# Patient Record
Sex: Male | Born: 2000 | Race: White | Hispanic: No | Marital: Single | Smoking: Never smoker
Health system: Southern US, Community
[De-identification: ages and names within clinical notes are randomized; demographics above are authoritative.]

## PROBLEM LIST (undated history)

## (undated) DIAGNOSIS — F1910 Other psychoactive substance abuse, uncomplicated: Secondary | ICD-10-CM

## (undated) DIAGNOSIS — F319 Bipolar disorder, unspecified: Secondary | ICD-10-CM

---
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
## 2022-02-01 ENCOUNTER — Emergency Department (HOSPITAL_BASED_OUTPATIENT_CLINIC_OR_DEPARTMENT_OTHER)
Admission: EM | Admit: 2022-02-01 | Discharge: 2022-02-01 | Disposition: A | Discharge status: Home / Self Care | Payer: Self-pay | Attending: Emergency Medicine | Admitting: Emergency Medicine

## 2022-02-01 ENCOUNTER — Encounter (HOSPITAL_BASED_OUTPATIENT_CLINIC_OR_DEPARTMENT_OTHER): Payer: Self-pay | Admitting: Emergency Medicine

## 2022-02-01 ENCOUNTER — Emergency Department (HOSPITAL_BASED_OUTPATIENT_CLINIC_OR_DEPARTMENT_OTHER): Payer: Self-pay

## 2022-02-01 DIAGNOSIS — R519 Headache, unspecified: Secondary | ICD-10-CM | POA: Diagnosis not present

## 2022-02-01 DIAGNOSIS — S60222A Contusion of left hand, initial encounter: Secondary | ICD-10-CM | POA: Insufficient documentation

## 2022-02-01 DIAGNOSIS — Y92410 Unspecified street and highway as the place of occurrence of the external cause: Secondary | ICD-10-CM | POA: Insufficient documentation

## 2022-02-01 DIAGNOSIS — S0093XA Contusion of unspecified part of head, initial encounter: Secondary | ICD-10-CM | POA: Diagnosis not present

## 2022-02-01 DIAGNOSIS — S0990XA Unspecified injury of head, initial encounter: Secondary | ICD-10-CM

## 2022-02-01 DIAGNOSIS — S199XXA Unspecified injury of neck, initial encounter: Secondary | ICD-10-CM | POA: Diagnosis present

## 2022-02-01 DIAGNOSIS — S161XXA Strain of muscle, fascia and tendon at neck level, initial encounter: Secondary | ICD-10-CM | POA: Insufficient documentation

## 2022-02-01 MED ORDER — METHOCARBAMOL 500 MG PO TABS: 500.0000 mg | ORAL_TABLET | Freq: Three times a day (TID) | ORAL | 0 refills | Status: DC | PRN

## 2022-02-01 MED ORDER — DICLOFENAC SODIUM 1 % EX GEL: 2.0000 g | Freq: Four times a day (QID) | CUTANEOUS | 0 refills | Status: DC | PRN

## 2022-02-01 MED ORDER — IBUPROFEN 800 MG PO TABS: 800.0000 mg | ORAL_TABLET | Freq: Three times a day (TID) | ORAL | 0 refills | Status: DC | PRN
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
## 2022-03-23 ENCOUNTER — Emergency Department (HOSPITAL_COMMUNITY)
Admission: EM | Admit: 2022-03-23 | Discharge: 2022-03-23 | Disposition: A | Discharge status: Home / Self Care | Payer: Medicaid Other | Attending: Emergency Medicine | Admitting: Emergency Medicine

## 2022-03-23 ENCOUNTER — Encounter (HOSPITAL_COMMUNITY): Payer: Self-pay

## 2022-03-23 ENCOUNTER — Other Ambulatory Visit: Payer: Self-pay

## 2022-03-23 DIAGNOSIS — H5789 Other specified disorders of eye and adnexa: Secondary | ICD-10-CM | POA: Diagnosis present

## 2022-03-23 DIAGNOSIS — H1033 Unspecified acute conjunctivitis, bilateral: Secondary | ICD-10-CM | POA: Insufficient documentation

## 2022-03-23 MED ORDER — POLYMYXIN B-TRIMETHOPRIM 10000-0.1 UNIT/ML-% OP SOLN
1.0000 [drp] | OPHTHALMIC | Status: DC
  Administered 2022-03-23: 1 [drp] via OPHTHALMIC
  Filled 2022-03-23: qty 10

## 2022-03-23 NOTE — Discharge Instructions (Signed)
Discontinue use of your eye drops. ? ?Instill 1 drop of Polytrim drops (provided in ER) in each eye every 4 waking hours for the next 5 days.  Take Benadryl or Zyrtec daily.   ? ?Follow-up with the eye doctor if not improving in 2 days.   ? ?Return for worsening symptoms. ?

## 2022-03-23 NOTE — ED Triage Notes (Signed)
Pt has redness and drainage to both eyes x 3 days.  ?

## 2022-03-23 NOTE — ED Provider Notes (Signed)
?[facility]Y DEPT ?[facility] Emergency Department ?Provider Note ?MRN:  [id number]  ?Arrival date & time: 03/23/22    ? ?Chief Complaint   ?Conjunctivitis ?  ?History of Present Illness   ?Ronald Leach is a 21 y.o. year-old male presents to the ED with chief complaint of bilateral eye irritation x 3 days.  Denies FB.  Denies trauma.  States that they are itchy and irritated.  Has been using OTC visine without relief.  Reports mild sensitivity to bright light.  Denies loss of vision.  States that he has had some drainage. ? ?History provided by patient. ? ? ?Review of Systems  ?Pertinent review of systems noted in HPI.  ? ? ?Physical Exam  ? ?Vitals:  ? 03/23/22 0130  ?BP: 117/81  ?Pulse: 96  ?Resp: 16  ?Temp: 98.3 ?F (36.8 ?C)  ?SpO2: 95%  ?  ?CONSTITUTIONAL:  well-appearing, NAD ?NEURO:  Alert and oriented x 3, CN 3-12 grossly intact ?EYES:  eyes equal and reactive, bilateral conjunctival erythema, no purulence ?ENT/NECK:  Supple, no stridor  ?CARDIO:  appears well-perfused  ?PULM:  No respiratory distress,  ?GI/GU:  non-distended,  ?MSK/SPINE:  No gross deformities, no edema, moves all extremities  ?SKIN:  no rash, atraumatic ? ? ?*Additional and/or pertinent findings included in MDM below ? ?Diagnostic and Interventional Summary  ? ? EKG Interpretation ? ?Date/Time:    ?Ventricular Rate:    ?PR Interval:    ?QRS Duration:   ?QT Interval:    ?QTC Calculation:   ?R Axis:     ?Text Interpretation:   ?  ? ?  ? ?Labs Reviewed - No data to display  ?No orders to display  ?  ?Medications  ?trimethoprim-polymyxin b (POLYTRIM) ophthalmic solution 1 drop (has no administration in time range)  ?  ? ?Procedures  /  Critical Care ?Procedures ? ?ED Course and Medical Decision Making  ?I have reviewed the triage vital signs, the nursing notes, and pertinent available records from the EMR. ? ?Social Determinants Affecting Complexity of Care: ?Patient has decreased access to medical care. ? ? ?ED Course: ?   ?Patient here with conjunctival erythema.  Top differential diagnoses include conjunctivitis, iritis. ?Medical Decision Making ?Problems Addressed: ?Acute conjunctivitis of both eyes, unspecified acute conjunctivitis type: acute illness or injury ?   Details: DC OTC drops.  Trial polytrim, but thought more likely to be allergic.  Recommended OTC benadryl or Zyrtec.  Ophthalmology follow-up if not better in 2 days. ? ?Risk ?Prescription drug management. ? ?  ? ?Consultants: ?No consultations were needed in caring for this patient. ? ? ?Treatment and Plan: ? ? ?Emergency department workup does not suggest an emergent condition requiring admission or immediate intervention beyond  what has been performed at this time. The patient is safe for discharge and has  been instructed to return immediately for worsening symptoms, change in  symptoms or any other concerns ? ? ? ?Final Clinical Impressions(s) / ED Diagnoses  ? ?  ICD-10-CM   ?1. Acute conjunctivitis of both eyes, unspecified acute conjunctivitis type  H10.33   ?  ?  ?ED Discharge Orders   ? ? None  ? ?  ?  ? ? ?Discharge Instructions Discussed with and Provided to Patient:  ? ? ?Discharge Instructions   ? ?  ?Discontinue use of your eye drops. ? ?Instill 1 drop of Polytrim drops (provided in ER) in each eye every 4 waking hours for the next 5 days.  Take Benadryl or  Zyrtec daily.   ? ?Follow-up with the eye doctor if not improving in 2 days.   ? ?Return for worsening symptoms. ? ? ? ?  ?Roxy Horseman, PA-C ?03/23/22 0206 ? ?  ?Tilden Fossa, MD ?03/23/22 [phone] ? ?
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
## 2023-07-23 ENCOUNTER — Other Ambulatory Visit: Payer: Self-pay

## 2023-07-23 ENCOUNTER — Emergency Department (HOSPITAL_COMMUNITY)
Admission: EM | Admit: 2023-07-23 | Discharge: 2023-07-23 | Disposition: A | Discharge status: Home / Self Care | Payer: Medicaid Other | Attending: Emergency Medicine | Admitting: Emergency Medicine

## 2023-07-23 ENCOUNTER — Encounter (HOSPITAL_COMMUNITY): Payer: Self-pay | Admitting: Emergency Medicine

## 2023-07-23 DIAGNOSIS — Z202 Contact with and (suspected) exposure to infections with a predominantly sexual mode of transmission: Secondary | ICD-10-CM | POA: Insufficient documentation

## 2023-07-23 LAB — RPR: RPR Ser Ql: NONREACTIVE

## 2023-07-23 LAB — HEPATITIS PANEL, ACUTE
HCV Ab: NONREACTIVE
Hep A IgM: NONREACTIVE
Hep B C IgM: NONREACTIVE
Hepatitis B Surface Ag: NONREACTIVE

## 2023-07-23 LAB — HIV ANTIBODY (ROUTINE TESTING W REFLEX): HIV Screen 4th Generation wRfx: NONREACTIVE

## 2023-07-23 LAB — URINALYSIS, ROUTINE W REFLEX MICROSCOPIC
Bilirubin Urine: NEGATIVE
Glucose, UA: NEGATIVE mg/dL
Hgb urine dipstick: NEGATIVE
Ketones, ur: 5 mg/dL — AB
Leukocytes,Ua: NEGATIVE
Nitrite: NEGATIVE
Protein, ur: NEGATIVE mg/dL
Specific Gravity, Urine: 1.025 (ref 1.005–1.030)
pH: 6 (ref 5.0–8.0)
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
## 2023-07-23 NOTE — Discharge Instructions (Signed)
You have been evaluated for potential STD.  You will be notified of the result if you test positive for any specific infection and you will receive treatment as appropriate.  You may also follow-up on the result through MyChart, link below.  Return if you have any concern.
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
## 2023-07-24 LAB — GC/CHLAMYDIA PROBE AMP (~~LOC~~) NOT AT ARMC
Chlamydia: NEGATIVE
Comment: NEGATIVE
Comment: NORMAL
Neisseria Gonorrhea: NEGATIVE

## 2023-07-31 ENCOUNTER — Emergency Department
Admission: EM | Admit: 2023-07-31 | Discharge: 2023-08-01 | Disposition: A | Discharge status: Other Acute Inpatient Hospital | Payer: Medicaid Other | Attending: Emergency Medicine | Admitting: Emergency Medicine

## 2023-07-31 ENCOUNTER — Encounter: Payer: Self-pay | Admitting: Emergency Medicine

## 2023-07-31 ENCOUNTER — Other Ambulatory Visit: Payer: Self-pay

## 2023-07-31 DIAGNOSIS — F314 Bipolar disorder, current episode depressed, severe, without psychotic features: Secondary | ICD-10-CM | POA: Diagnosis present

## 2023-07-31 DIAGNOSIS — F32A Depression, unspecified: Secondary | ICD-10-CM | POA: Diagnosis not present

## 2023-07-31 DIAGNOSIS — A64 Unspecified sexually transmitted disease: Secondary | ICD-10-CM | POA: Insufficient documentation

## 2023-07-31 DIAGNOSIS — R45851 Suicidal ideations: Secondary | ICD-10-CM | POA: Insufficient documentation

## 2023-07-31 DIAGNOSIS — R45850 Homicidal ideations: Secondary | ICD-10-CM | POA: Insufficient documentation

## 2023-07-31 DIAGNOSIS — F1914 Other psychoactive substance abuse with psychoactive substance-induced mood disorder: Secondary | ICD-10-CM | POA: Diagnosis present

## 2023-07-31 DIAGNOSIS — F1520 Other stimulant dependence, uncomplicated: Secondary | ICD-10-CM | POA: Diagnosis present

## 2023-07-31 DIAGNOSIS — Z21 Asymptomatic human immunodeficiency virus [HIV] infection status: Secondary | ICD-10-CM | POA: Insufficient documentation

## 2023-07-31 DIAGNOSIS — R259 Unspecified abnormal involuntary movements: Secondary | ICD-10-CM | POA: Diagnosis not present

## 2023-07-31 DIAGNOSIS — Z79899 Other long term (current) drug therapy: Secondary | ICD-10-CM | POA: Diagnosis not present

## 2023-07-31 DIAGNOSIS — F419 Anxiety disorder, unspecified: Secondary | ICD-10-CM | POA: Insufficient documentation

## 2023-07-31 DIAGNOSIS — F319 Bipolar disorder, unspecified: Secondary | ICD-10-CM

## 2023-07-31 DIAGNOSIS — F1994 Other psychoactive substance use, unspecified with psychoactive substance-induced mood disorder: Secondary | ICD-10-CM

## 2023-07-31 LAB — COMPREHENSIVE METABOLIC PANEL
ALT: 14 U/L (ref 0–44)
AST: 19 U/L (ref 15–41)
Albumin: 4.7 g/dL (ref 3.5–5.0)
Alkaline Phosphatase: 65 U/L (ref 38–126)
Anion gap: 9 (ref 5–15)
BUN: 12 mg/dL (ref 6–20)
CO2: 27 mmol/L (ref 22–32)
Calcium: 9.2 mg/dL (ref 8.9–10.3)
Chloride: 102 mmol/L (ref 98–111)
Creatinine, Ser: 0.86 mg/dL (ref 0.61–1.24)
GFR, Estimated: >60 mL/min (ref >60)
Glucose, Bld: 110 mg/dL — ABNORMAL HIGH (ref 70–99)
Potassium: 3.7 mmol/L (ref 3.5–5.1)
Sodium: 138 mmol/L (ref 135–145)
Total Bilirubin: 0.9 mg/dL (ref 0.3–1.2)
Total Protein: 7.4 g/dL (ref 6.5–8.1)

## 2023-07-31 LAB — CBC
HCT: 44.4 % (ref 39.0–52.0)
Hemoglobin: 15.1 g/dL (ref 13.0–17.0)
MCH: 30.8 pg (ref 26.0–34.0)
MCHC: 34 g/dL (ref 30.0–36.0)
MCV: 90.6 fL (ref 80.0–100.0)
Platelets: 313 10*3/uL (ref 150–400)
RBC: 4.9 MIL/uL (ref 4.22–5.81)
RDW: 12.5 % (ref 11.5–15.5)
WBC: 10.7 10*3/uL — ABNORMAL HIGH (ref 4.0–10.5)
nRBC: 0 % (ref 0.0–0.2)

## 2023-07-31 LAB — SALICYLATE LEVEL: Salicylate Lvl: <7 mg/dL — ABNORMAL LOW (ref 7.0–30.0)

## 2023-07-31 LAB — ACETAMINOPHEN LEVEL: Acetaminophen (Tylenol), Serum: <10 ug/mL — ABNORMAL LOW (ref 10–30)

## 2023-07-31 LAB — ETHANOL: Alcohol, Ethyl (B): <10 mg/dL (ref <10)

## 2023-07-31 MED ORDER — HYDROXYZINE HCL 25 MG PO TABS: 25.0000 mg | ORAL_TABLET | Freq: Three times a day (TID) | ORAL | Status: DC | PRN

## 2023-07-31 MED ORDER — OLANZAPINE 5 MG PO TABS
2.5000 mg | ORAL_TABLET | Freq: Two times a day (BID) | ORAL | Status: DC
  Filled 2023-07-31 (×2): qty 1

## 2023-07-31 MED ORDER — TRAZODONE HCL 50 MG PO TABS: 50.0000 mg | ORAL_TABLET | Freq: Every day | ORAL | Status: DC

## 2023-07-31 NOTE — ED Notes (Signed)
Pt dressed out into the appropriate BH scrub attire by this RN and Doree Fudge, EDT, belongings include:  White shirt Multi-colored crocs Gray pants  Blacks socks Pink/blue underwear Rainbow colored watch Silver ring

## 2023-07-31 NOTE — ED Notes (Signed)
Pt request that chaplin come to see him. Chaplin paged and to bedside
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
## 2023-07-31 NOTE — Progress Notes (Signed)
   07/31/23 1900  Spiritual Encounters  Type of Visit Initial  Care provided to: Patient  Referral source Nurse (RN/NT/LPN)  Reason for visit Urgent spiritual support  OnCall Visit Yes  Spiritual Framework  Presenting Themes Meaning/purpose/sources of inspiration  Patient Stress Factors Major life changes;Lack of knowledge  Interventions  Spiritual Care Interventions Made Established relationship of care and support;Compassionate presence;Reflective listening;Normalization of emotions;Explored ethical dilemma  Intervention Outcomes  Outcomes Connection to spiritual care;Awareness around self/spiritual resourses;Awareness of support  Spiritual Care Plan  Spiritual Care Issues Still Outstanding No further spiritual care needs at this time (see row info)   Patient is dealing with major life changes and making some major life decisions. Patient is a drug addict that is dealing with some family issues and relationship issues as well. Patient is looking and hope for some life changing events in his life. Patient has faith in God but also understands that he have to change his ways for change to happen. PT said that he leaving any situation that going to effect his life in a negative way. PT seem please to have a chance to talk his plan out to someone.

## 2023-07-31 NOTE — Consult Note (Addendum)
[facility] Face-to-Face Psychiatry Consult   Reason for Consult:  SI Referring Physician:  Rochele Raring MD Patient Identification: Ronald Leach MRN:  [id number] Diagnosis:  Active Problems:   Substance induced mood disorder (HCC)   Bipolar disorder (HCC)   Total Time spent with patient: 30 minutes  "My aunt and dad are upset about me; I have dissociated from parts of my life that I don't want to deal with"   HPI:    Ronald Leach is a 22 y.o., male with past psychiatric history of bipolar disorder, methamphetamine use disorder, and suicidal ideation. He was recently admitted to a [facility] in July of this year. He presents to [facility] ED with law enforcement under IVC.  Per IVC paperwork taken out by patient's aunt "respondent has history of mental illness and isn't taking his medications as prescribed.  He is exhibiting rapid mood swings.  He is a substance abuser, namely meth. He threatened to kill his boyfriend and is threatening to kill himself.  He isn't eating and isn't sleeping.  He has a sexual transmitted disease and is refusing to get medical treatment and is willfully engaging in unprotected sex."   During evaluation Ronald Leach is seated in the interview room in no acute distress. He is alert, oriented x 4, cooperative and attentive. His mood is dysphoric and anxious with congruent affect. He has normal speech, and behavior. Objectively there is no evidence of psychosis or delusional thinking. The patient endorses elevated mood when he uses methamphetamine and reports last use was last night at 8PM. He reports that when he is coming off of the high he experiences lows and this is typically when he experiences passive suicidal ideation. He reports he typically uses methamphetamine twice daily. Patient is able to converse coherently, goal directed thoughts, no distractibility, or pre-occupation. He denies active suicidal/self-harm/homicidal ideation, psychosis, and paranoia. He  does endorse passive suicidal ideation without a plan. He is forthcoming about being depressed and does want to engage in care for this. Patient answered questions appropriately.    When asked about the statements made in the IVC paperwork Ronald Leach reports he and his boyfriend got in an argument on the phone and Ronald Leach was livid and screaming at the boyfriend. Unknown to Ronald Leach the conversation was recorded by the boyfriend and later shared with the patient's Aunt. Ronald Leach admits that he made statements to the boyfriend threatening to harm himself and threatening to harm the boyfriend. He reports he was livid with the boyfriend and unable to control his anger. He also feels his boyfriend was intentionally manipulating him into saying things knowing he was recording them to later be used against Ronald Leach. He states he needs to re-evaluate the relationship and mentions an imbalance in power dynamics, the boyfriend being manipulative, and there being a 10 year age difference that he feels contributes to the power dynamic. He also reports he and his boyfriend engage in methamphetamine use together.   Ronald Leach reports he lives with his father and is currently unemployed. He stopped working shortly after being discharged from [facility] citing the work environments being a stressor for him. He reports that he is currently on academic probation at [employer] and is working towards being able to re-enroll. He is not currently seeing a psychiatrist or therapist and reports non-compliance with Lithium because he did not like the way it made him feel. He reports feeling as though Lithium wiped all of his emotions  away.  Support encouragement and reassurance provided about ongoing stressors, patient provided with opportunity for questions.  Discussed recommendation for inpatient psychiatric admission for stabilization and treatment.  Discussed milieu and expectations. Patient is in agreement.  Past Psychiatric  History: Bipolar, Methamphetamine Use Disorder, Suicidal Ideation  Risk to Self:  Yes Risk to Others:  No Prior Inpatient Therapy:  Yes Prior Outpatient Therapy:  Denies  Past Medical History: History reviewed. No pertinent past medical history. History reviewed. No pertinent surgical history. Family History: History reviewed. No pertinent family history. Family Psychiatric  History: Denies Social History:  Social History   Substance and Sexual Activity  Alcohol Use None     Social History   Substance and Sexual Activity  Drug Use Never    Social History   Socioeconomic History   Marital status: Single    Spouse name: Not on file   Number of children: Not on file   Years of education: Not on file   Highest education level: Not on file  Occupational History   Not on file  Tobacco Use   Smoking status: Never   Smokeless tobacco: Never  Substance and Sexual Activity   Alcohol use: Not on file   Drug use: Never   Sexual activity: Not on file  Other Topics Concern   Not on file  Social History Narrative   Not on file   Social Determinants of Health   Financial Resource Strain: Not on file  Food Insecurity: Not on file  Transportation Needs: Not on file  Physical Activity: Not on file  Stress: Not on file  Social Connections: Not on file   Additional Social History:    Allergies:  No Known Allergies  Labs:  Results for orders placed or performed during the hospital encounter of 07/31/23 (from the past 48 hour(s))  Comprehensive metabolic panel     Status: Abnormal   Collection Time: 07/31/23  2:34 AM  Result Value Ref Range   Sodium 138 135 - 145 mmol/L   Potassium 3.7 3.5 - 5.1 mmol/L   Chloride 102 98 - 111 mmol/L   CO2 27 22 - 32 mmol/L   Glucose, Bld 110 (H) 70 - 99 mg/dL    Comment: Glucose reference range applies only to samples taken after fasting for at least 8 hours.   BUN 12 6 - 20 mg/dL   Creatinine, Ser 1.61 0.61 - 1.24 mg/dL   Calcium 9.2  8.9 - 09.6 mg/dL   Total Protein 7.4 6.5 - 8.1 g/dL   Albumin 4.7 3.5 - 5.0 g/dL   AST 19 15 - 41 U/L   ALT 14 0 - 44 U/L   Alkaline Phosphatase 65 38 - 126 U/L   Total Bilirubin 0.9 0.3 - 1.2 mg/dL   GFR, Estimated >04 >54 mL/min    Comment: (NOTE) Calculated using the CKD-EPI Creatinine Equation (2021)    Anion gap 9 5 - 15    Comment: Performed at [facility], [address] [place], [place] [address]  Ethanol     Status: None   Collection Time: 07/31/23  2:34 AM  Result Value Ref Range   Alcohol, Ethyl (B) <10 <10 mg/dL    Comment: (NOTE) Lowest detectable limit for serum alcohol is 10 mg/dL.  For medical purposes only. Performed at [facility], [address] [place], [place] [address]   Salicylate level     Status: Abnormal   Collection Time: 07/31/23  2:34 AM  Result Value  Ref Range   Salicylate Lvl <7.0 (L) 7.0 - 30.0 mg/dL    Comment: Performed at [facility], [address] [place], [place] [address]  Acetaminophen level     Status: Abnormal   Collection Time: 07/31/23  2:34 AM  Result Value Ref Range   Acetaminophen (Tylenol), Serum <10 (L) 10 - 30 ug/mL    Comment: (NOTE) Therapeutic concentrations vary significantly. A range of 10-30 ug/mL  may be an effective concentration for many patients. However, some  are best treated at concentrations outside of this range. Acetaminophen concentrations >150 ug/mL at 4 hours after ingestion  and >50 ug/mL at 12 hours after ingestion are often associated with  toxic reactions.  Performed at [facility], [address] [place], [place] [address]   cbc     Status: Abnormal   Collection Time: 07/31/23  2:34 AM  Result Value Ref Range   WBC 10.7 (H) 4.0 - 10.5 K/uL   RBC 4.90 4.22 - 5.81 MIL/uL   Hemoglobin 15.1 13.0 - 17.0 g/dL   HCT 82.9 56.2 - 13.0 %   MCV 90.6 80.0 - 100.0 fL   MCH 30.8 26.0 - 34.0 pg   MCHC 34.0 30.0 - 36.0 g/dL   RDW 86.5 78.4 - 69.6 %    Platelets 313 150 - 400 K/uL   nRBC 0.0 0.0 - 0.2 %    Comment: Performed at [facility], [address] [place], [place] [address]    Current Facility-Administered Medications  Medication Dose Route Frequency Provider Last Rate Last Admin   hydrOXYzine (ATARAX) tablet 25 mg  25 mg Oral TID PRN Furkan Keenum L, NP       OLANZapine (ZYPREXA) tablet 2.5 mg  2.5 mg Oral BID Kushi Kun L, NP       traZODone (DESYREL) tablet 50 mg  50 mg Oral QHS Syrus Nakama L, NP       Current Outpatient Medications  Medication Sig Dispense Refill   divalproex (DEPAKOTE ER) 500 MG 24 hr tablet Take 1,500 mg by mouth daily.     lithium carbonate (LITHOBID) 300 MG ER tablet Take 900 mg by mouth at bedtime.     sertraline (ZOLOFT) 50 MG tablet Take 50 mg by mouth daily.     DESCOVY 200-25 MG tablet Take 1 tablet by mouth daily.      Musculoskeletal: Strength & Muscle Tone: within normal limits Gait & Station: normal Patient leans: N/A  Psychiatric Specialty Exam:  Presentation  General Appearance:  Appropriate for Environment  Eye Contact: Fair  Speech: Normal Rate  Speech Volume: Decreased  Handedness: Right   Mood and Affect  Mood: Dysphoric; Anxious  Affect: Congruent   Thought Process  Thought Processes: Coherent  Descriptions of Associations:Intact  Orientation:Full (Time, Place and Person)  Thought Content:Logical  History of Schizophrenia/Schizoaffective disorder:No  Duration of Psychotic Symptoms:No data recorded Hallucinations:Hallucinations: None  Ideas of Reference:None  Suicidal Thoughts:Suicidal Thoughts: Yes, Passive SI Passive Intent and/or Plan: Without Intent; Without Plan  Homicidal Thoughts:Homicidal Thoughts: No   Sensorium  Memory:No data recorded Judgment: Poor  Insight: Poor   Executive Functions  Concentration: Good  Attention Span: Good  Recall: Good  Fund of Knowledge: Good  Language: Good   Psychomotor  Activity  Psychomotor Activity: Psychomotor Activity: Tremor   Assets  Assets: Desire for Improvement; Communication Skills   Sleep  Sleep: Sleep: Fair   Physical Exam HENT:     Head: Normocephalic.  Cardiovascular:  Rate and Rhythm: Normal rate.  Pulmonary:     Effort: Pulmonary effort is normal.  Musculoskeletal:        General: Normal range of motion.  Neurological:     General: No focal deficit present.     Mental Status: He is alert.  Psychiatric:        Mood and Affect: Mood is anxious and depressed.        Speech: Speech normal.        Behavior: Behavior is cooperative.        Thought Content: Thought content includes suicidal ideation.        Cognition and Memory: Cognition normal.    Review of Systems  Constitutional:  Negative for chills and fever.  Respiratory:  Negative for cough and shortness of breath.   Skin:  Negative for rash.  Neurological:  Negative for focal weakness.  Psychiatric/Behavioral:  Positive for depression, substance abuse and suicidal ideas. The patient is nervous/anxious.    Blood pressure 129/89, pulse 79, temperature 98.3 F (36.8 C), temperature source Oral, resp. rate 17, height 5\' 7"  (1.702 m), weight 55.3 kg, SpO2 100%. Body mass index is 19.11 kg/m.  Treatment Plan Summary: Daily contact with patient to assess and evaluate symptoms and progress in treatment  - Start Zyprexa 2.5 mg BID for mood stabilization. Patient reports he stopped taking Lithium prescribed at discharge from [facility] because he did not like how the medication made him feel.  - PRN Trazodone for sleep  - PRN Hydroxyzine for anxiety  Disposition: Recommend psychiatric Inpatient admission when medically cleared.  This document was prepared using Dragon voice recognition software and may include unintentional dictation errors.  Bishop Limbo DNP, MBA, PMHNP-BC, FNP-BC  Psychiatric Mental Health Nurse Practitioner [facility]

## 2023-07-31 NOTE — ED Notes (Signed)
IVC recommends inpatient

## 2023-07-31 NOTE — ED Notes (Signed)
IVC prior to arrival/ Consult ordered/Pending

## 2023-07-31 NOTE — ED Triage Notes (Signed)
Pt presents [place] triage via [place] [employer] under IVC. Per paperwork, the patient has had erratic mood swings and is non compliant with medications. Pt a has a known hx of STD and is isn't using safe sex practices. A&Ox4 at this time. Denies SI/HI nor hallucinations.

## 2023-07-31 NOTE — ED Provider Notes (Addendum)
[facility] Provider Note    Event Date/Time   First MD Initiated Contact with Patient 07/31/23 [phone]     (approximate)   History   Psychiatric Evaluation (/)   HPI  Ronald Leach is a 22 y.o. male with history of methamphetamine abuse who presents to the emergency department under involuntary commitment.  When patient is asked what brings him here to the emergency department he states "I just have a lot going on" he is not very forthcoming with information.  He denies having SI, HI or hallucinations.  Admits to methamphetamine use today.  Denies alcohol use.   Per IVC paperwork taken out by patient's aunt "respondent has history of mental illness that is not taking his medications as prescribed.  He is exhibiting rapid mood swings.  He is a substance abuser, namely meth.  He threatened to kill his boyfriend and is starting to kill himself.  He is not eating and is not sleeping.  He has a sexual transmitted disease and is refusing to get medical treatment and is willfully engaging in unprotected sex."  When patient is asked about his threats of harming himself and his boyfriend he states that his boyfriend also has mental illness and is a drug user and that he is a "catalyst" for him.  Patient states that he does not have any known sexually transmitted disease but officer at bedside states that aunt told him that patient has HIV.  Patient states he was just at the [facility] department for full STD screening.  He states he has had syphilis before but states this has been treated.  History provided by patient, IVC paperwork.    History reviewed. No pertinent past medical history.  History reviewed. No pertinent surgical history.  MEDICATIONS:  Prior to Admission medications   Medication Sig Start Date End Date Taking? Authorizing Provider  amoxicillin-clavulanate (AUGMENTIN) 875-125 MG tablet Take 1 tablet by mouth every 12 (twelve) hours.  04/21/21   Rancour, Jeannett Senior, MD  bacitracin ointment Apply 1 application topically 2 (two) times daily. 04/21/21   Rancour, Jeannett Senior, MD  diclofenac Sodium (VOLTAREN) 1 % GEL Apply 2 g topically 4 (four) times daily as needed. 02/01/22   Long, Arlyss Repress, MD  ibuprofen (ADVIL) 800 MG tablet Take 1 tablet (800 mg total) by mouth every 8 (eight) hours as needed for moderate pain. 02/01/22   Long, Arlyss Repress, MD  methocarbamol (ROBAXIN) 500 MG tablet Take 1 tablet (500 mg total) by mouth every 8 (eight) hours as needed for muscle spasms. 02/01/22   LongArlyss Repress, MD    Physical Exam   Triage Vital Signs: ED Triage Vitals  Encounter Vitals Group     BP 07/31/23 0224 (!) 134/92     Systolic BP Percentile --      Diastolic BP Percentile --      Pulse Rate 07/31/23 0224 85     Resp 07/31/23 0224 18     Temp 07/31/23 0224 98.7 F (37.1 C)     Temp Source 07/31/23 0224 Oral     SpO2 07/31/23 0224 100 %     Weight 07/31/23 0238 122 lb (55.3 kg)     Height 07/31/23 0238 5\' 7"  (1.702 m)     Head Circumference --      Peak Flow --      Pain Score 07/31/23 0240 6     Pain Loc --      Pain Education --  Exclude from Growth Chart --     Most recent vital signs: Vitals:   07/31/23 0224  BP: (!) 134/92  Pulse: 85  Resp: 18  Temp: 98.7 F (37.1 C)  SpO2: 100%    CONSTITUTIONAL: Alert, responds appropriately to questions. Well-appearing; well-nourished HEAD: Normocephalic, atraumatic EYES: Conjunctivae clear, pupils appear equal, sclera nonicteric ENT: normal nose; moist mucous membranes NECK: Supple, normal ROM CARD: RRR; S1 and S2 appreciated RESP: Normal chest excursion without splinting or tachypnea; breath sounds clear and equal bilaterally; no wheezes, no rhonchi, no rales, no hypoxia or respiratory distress, speaking full sentences ABD/GI: Non-distended; soft, non-tender, no rebound, no guarding, no peritoneal signs BACK: The back appears normal EXT: Normal ROM in all joints; no  deformity noted, no edema SKIN: Normal color for age and race; warm; no rash on exposed skin NEURO: Moves all extremities equally, normal speech PSYCH: Calm, cooperative, not responding to internal stimuli, no disorganized thought process, poor eye contact.  Denies SI, HI.   ED Results / Procedures / Treatments   LABS: (all labs ordered are listed, but only abnormal results are displayed) Labs Reviewed  COMPREHENSIVE METABOLIC PANEL - Abnormal; Notable for the following components:      Result Value   Glucose, Bld 110 (*)    All other components within normal limits  SALICYLATE LEVEL - Abnormal; Notable for the following components:   Salicylate Lvl <7.0 (*)    All other components within normal limits  ACETAMINOPHEN LEVEL - Abnormal; Notable for the following components:   Acetaminophen (Tylenol), Serum <10 (*)    All other components within normal limits  CBC - Abnormal; Notable for the following components:   WBC 10.7 (*)    All other components within normal limits  ETHANOL  URINE DRUG SCREEN, QUALITATIVE ([facility] ONLY)     EKG:  RADIOLOGY: My personal review and interpretation of imaging:    I have personally reviewed all radiology reports.   No results found.   PROCEDURES:  Critical Care performed: No   CRITICAL CARE Performed by: Rochele Raring   Total critical care time: 0 minutes  Critical care time was exclusive of separately billable procedures and treating other patients.  Critical care was necessary to treat or prevent imminent or life-threatening deterioration.  Critical care was time spent personally by me on the following activities: development of treatment plan with patient and/or surrogate as well as nursing, discussions with consultants, evaluation of patient's response to treatment, examination of patient, obtaining history from patient or surrogate, ordering and performing treatments and interventions, ordering and review of laboratory studies,  ordering and review of radiographic studies, pulse oximetry and re-evaluation of patient's condition.   Procedures    IMPRESSION / MDM / ASSESSMENT AND PLAN / ED COURSE  I reviewed the triage vital signs and the nursing notes.    Patient here for SI, HI today.  History of methamphetamine use.     DIFFERENTIAL DIAGNOSIS (includes but not limited to):   Mood disorder secondary to substance abuse, depression, anxiety   Patient's presentation is most consistent with acute presentation with potential threat to life or bodily function.   PLAN: Will obtain screening labs, urine.  Will consult TTS and psychiatry.  Patient under full IVC at this time.   MEDICATIONS GIVEN IN ED: Medications - No data to display   ED COURSE: Patient's labs are unremarkable other than a mild leukocytosis.  Negative Tylenol, salicylate and ethanol level.  Patient is medically cleared.  CONSULTS:  TTS and psychiatry consulted for further disposition.   5:47 AM  Spoke with Bishop Limbo, NP with psychiatry.  She recommends inpatient admission.  OUTSIDE RECORDS REVIEWED: Reviewed lab work on 07/23/2023.  HIV and syphilis nonreactive.  Gonorrhea and Chlamydia negative at that time.  Hepatitis panel also nonreactive.       FINAL CLINICAL IMPRESSION(S) / ED DIAGNOSES   Final diagnoses:  Suicidal ideation  Homicidal ideation     Rx / DC Orders   ED Discharge Orders     None        Note:  This document was prepared using Dragon voice recognition software and may include unintentional dictation errors.   Lashai Grosch, Layla Maw, DO 07/31/23 0430    Thomas Rhude, Layla Maw, DO 07/31/23 [phone]

## 2023-08-01 ENCOUNTER — Encounter (HOSPITAL_COMMUNITY): Payer: Self-pay | Admitting: Psychiatric/Mental Health

## 2023-08-01 ENCOUNTER — Inpatient Hospital Stay (HOSPITAL_COMMUNITY)
Admission: AD | Admit: 2023-08-01 | Discharge: 2023-08-06 | DRG: 885 | Disposition: A | Discharge status: Home / Self Care | Payer: Medicaid Other | Source: Intra-hospital | Attending: Pediatrics | Admitting: Pediatrics

## 2023-08-01 ENCOUNTER — Other Ambulatory Visit: Payer: Self-pay

## 2023-08-01 DIAGNOSIS — G4700 Insomnia, unspecified: Secondary | ICD-10-CM | POA: Diagnosis present

## 2023-08-01 DIAGNOSIS — F1520 Other stimulant dependence, uncomplicated: Secondary | ICD-10-CM | POA: Diagnosis present

## 2023-08-01 DIAGNOSIS — Z79899 Other long term (current) drug therapy: Secondary | ICD-10-CM | POA: Diagnosis not present

## 2023-08-01 DIAGNOSIS — F3110 Bipolar disorder, current episode manic without psychotic features, unspecified: Principal | ICD-10-CM

## 2023-08-01 DIAGNOSIS — Z818 Family history of other mental and behavioral disorders: Secondary | ICD-10-CM

## 2023-08-01 DIAGNOSIS — Z91148 Patient's other noncompliance with medication regimen for other reason: Secondary | ICD-10-CM

## 2023-08-01 DIAGNOSIS — F319 Bipolar disorder, unspecified: Secondary | ICD-10-CM | POA: Diagnosis present

## 2023-08-01 DIAGNOSIS — F333 Major depressive disorder, recurrent, severe with psychotic symptoms: Secondary | ICD-10-CM | POA: Diagnosis not present

## 2023-08-01 DIAGNOSIS — R41843 Psychomotor deficit: Secondary | ICD-10-CM | POA: Diagnosis present

## 2023-08-01 DIAGNOSIS — R45851 Suicidal ideations: Secondary | ICD-10-CM | POA: Diagnosis present

## 2023-08-01 DIAGNOSIS — F1914 Other psychoactive substance abuse with psychoactive substance-induced mood disorder: Secondary | ICD-10-CM | POA: Diagnosis not present

## 2023-08-01 DIAGNOSIS — F32A Depression, unspecified: Principal | ICD-10-CM | POA: Diagnosis present

## 2023-08-01 DIAGNOSIS — Z560 Unemployment, unspecified: Secondary | ICD-10-CM

## 2023-08-01 LAB — URINE DRUG SCREEN, QUALITATIVE (ARMC ONLY)
Amphetamines, Ur Screen: POSITIVE — AB
Barbiturates, Ur Screen: NOT DETECTED
Benzodiazepine, Ur Scrn: NOT DETECTED
Cannabinoid 50 Ng, Ur ~~LOC~~: POSITIVE — AB
Cocaine Metabolite,Ur ~~LOC~~: NOT DETECTED
MDMA (Ecstasy)Ur Screen: NOT DETECTED
Methadone Scn, Ur: NOT DETECTED
Opiate, Ur Screen: NOT DETECTED
Phencyclidine (PCP) Ur S: NOT DETECTED
Tricyclic, Ur Screen: NOT DETECTED

## 2023-08-01 LAB — BASIC METABOLIC PANEL
Anion gap: 9 (ref 5–15)
BUN: 13 mg/dL (ref 6–20)
CO2: 29 mmol/L (ref 22–32)
Calcium: 9.3 mg/dL (ref 8.9–10.3)
Chloride: 100 mmol/L (ref 98–111)
Creatinine, Ser: 0.85 mg/dL (ref 0.61–1.24)
GFR, Estimated: >60 mL/min (ref >60)
Glucose, Bld: 86 mg/dL (ref 70–99)
Potassium: 4.4 mmol/L (ref 3.5–5.1)
Sodium: 138 mmol/L (ref 135–145)

## 2023-08-01 MED ORDER — HYDROXYZINE HCL 25 MG PO TABS
25.0000 mg | ORAL_TABLET | Freq: Three times a day (TID) | ORAL | Status: DC | PRN
  Filled 2023-08-01: qty 1

## 2023-08-01 MED ORDER — NICOTINE POLACRILEX 2 MG MT GUM: 2.0000 mg | CHEWING_GUM | OROMUCOSAL | Status: DC | PRN

## 2023-08-01 MED ORDER — ZIPRASIDONE MESYLATE 20 MG IM SOLR: 20.0000 mg | INTRAMUSCULAR | Status: DC | PRN

## 2023-08-01 MED ORDER — TRAZODONE HCL 50 MG PO TABS
50.0000 mg | ORAL_TABLET | Freq: Every day | ORAL | Status: DC
  Administered 2023-08-05: 50 mg via ORAL
  Filled 2023-08-01 (×8): qty 1

## 2023-08-01 MED ORDER — HALOPERIDOL LACTATE 5 MG/ML IJ SOLN: 5.0000 mg | Freq: Three times a day (TID) | INTRAMUSCULAR | Status: DC | PRN

## 2023-08-01 MED ORDER — OLANZAPINE 2.5 MG PO TABS
2.5000 mg | ORAL_TABLET | Freq: Two times a day (BID) | ORAL | Status: DC
  Filled 2023-08-01 (×2): qty 1

## 2023-08-01 MED ORDER — LORAZEPAM 1 MG PO TABS: 1.0000 mg | ORAL_TABLET | ORAL | Status: DC | PRN

## 2023-08-01 MED ORDER — RISPERIDONE 0.5 MG PO TBDP: 2.0000 mg | ORAL_TABLET | Freq: Three times a day (TID) | ORAL | Status: DC | PRN

## 2023-08-01 MED ORDER — RISPERIDONE 2 MG PO TBDP: 2.0000 mg | ORAL_TABLET | Freq: Three times a day (TID) | ORAL | Status: DC | PRN

## 2023-08-01 MED ORDER — MAGNESIUM HYDROXIDE 400 MG/5ML PO SUSP
30.0000 mL | Freq: Every day | ORAL | Status: DC | PRN
  Administered 2023-08-03 – 2023-08-05 (×2): 30 mL via ORAL
  Filled 2023-08-01 (×2): qty 30

## 2023-08-01 MED ORDER — ARIPIPRAZOLE 5 MG PO TABS
5.0000 mg | ORAL_TABLET | Freq: Every day | ORAL | Status: DC
  Administered 2023-08-01 – 2023-08-02 (×2): 5 mg via ORAL
  Filled 2023-08-01 (×6): qty 1

## 2023-08-01 MED ORDER — DIPHENHYDRAMINE HCL 50 MG/ML IJ SOLN: 50.0000 mg | Freq: Three times a day (TID) | INTRAMUSCULAR | Status: DC | PRN

## 2023-08-01 MED ORDER — LORAZEPAM 1 MG PO TABS: 2.0000 mg | ORAL_TABLET | Freq: Three times a day (TID) | ORAL | Status: DC | PRN

## 2023-08-01 MED ORDER — TRAZODONE HCL 50 MG PO TABS: 50.0000 mg | ORAL_TABLET | Freq: Every evening | ORAL | Status: DC | PRN

## 2023-08-01 MED ORDER — HALOPERIDOL 5 MG PO TABS: 5.0000 mg | ORAL_TABLET | Freq: Three times a day (TID) | ORAL | Status: DC | PRN

## 2023-08-01 MED ORDER — HYDROXYZINE HCL 25 MG PO TABS
25.0000 mg | ORAL_TABLET | Freq: Three times a day (TID) | ORAL | Status: DC | PRN
  Administered 2023-08-01 – 2023-08-05 (×6): 25 mg via ORAL
  Filled 2023-08-01 (×5): qty 1

## 2023-08-01 MED ORDER — ACETAMINOPHEN 325 MG PO TABS: 650.0000 mg | ORAL_TABLET | Freq: Four times a day (QID) | ORAL | Status: DC | PRN

## 2023-08-01 MED ORDER — LORAZEPAM 2 MG/ML IJ SOLN: 2.0000 mg | Freq: Three times a day (TID) | INTRAMUSCULAR | Status: DC | PRN

## 2023-08-01 MED ORDER — ALUM & MAG HYDROXIDE-SIMETH 200-200-20 MG/5ML PO SUSP: 30.0000 mL | ORAL | Status: DC | PRN

## 2023-08-01 MED ORDER — DIPHENHYDRAMINE HCL 25 MG PO CAPS: 50.0000 mg | ORAL_CAPSULE | Freq: Three times a day (TID) | ORAL | Status: DC | PRN

## 2023-08-01 NOTE — H&P (Signed)
Psychiatric Admission Assessment Adult  Patient Identification: Ronald Leach MRN:  [id number] Date of Evaluation:  08/01/2023 Chief Complaint:  Depression [F32.A] Principal Diagnosis: Depression Diagnosis:  Principal Problem:   Depression Identifying information and reason for admission: The patient is a 22 year old Caucasian male with a long past history of bipolar disorder, amphetamine use disorder and suicidal ideations who was admitted on an IVC initiated by his aunt. History of Present Illness: The information was primarily obtained from patient interview as well as from the records.  Ronald Leach is a 22 year old male with a history of bipolar disorder who is on his third admission initiated by his aunt.  Apparently his admission was initiated after she heard a recording from Athony's boyfriend threatening to kill himself and the boyfriend.  Apparently has been abusing methamphetamine at least twice a day and has not been taking any medications.  He was sent to the [facility] ED with law enforcement on an IVC. As per the IVC papers the patient has been noncompliant with the medications.  He has been exhibiting rapid mood swings, threatening to kill his boyfriend and himself.  He has not been eating or sleeping.  Apparently also has a sexually-transmitted disease and has been refusing to get medical treatment and was willfully engaging in unprotected sex. Circumstances leading to the IVC include an unstable relationship with his current boyfriend.  Apparently there is a 20-year age difference and he feels that it contributes to a dynamic for struggle.  He admits that he and his boyfriend both engage in using amphetamine together.  He feels that there is some manipulation regarding his boyfriend taking out the IVC papers because he knew that he was recording him.  The patient lives with his father and is currently unemployed.  The patient's boyfriend also lives with his father and is unemployed.   They do not have a place of their own.  He is on academic probation from [facility].  He is not currently under the care of any psychiatrist or therapist and reports the prior exposure to lithium and Depakote both of which made him lose his emotions and he does not want to start them back.  On examination today: The patient was noted to be alert oriented and fairly cooperative but withdrawn and anxious.  He maintained fair eye contact.  His speech was of low volume with some latency and sometimes difficult to hear.  There is no obvious looseness of associations or flight of ideas but he continues to report feeling depressed and frustrated.  He is also angry that his aunt has IVCD him because of what his boyfriend had recorded after they had an argument.  He denies any active suicidal or homicidal ideations but does admit to mood swings and having feelings of depression to the point where he has passive suicidal ideations and does feel that life is not worth living.  He admits to abusing methamphetamine on a regular basis but feels that it makes him happy and cheerful and does not cause mood swings.  However he is family notices and endorses the mood swings.  Patient admits to history of bipolar disorder and has been tried on lithium and Depakote.  He is contracting for safety but does not want to try lithium or Depakote but is willing to consider a trial of Abilify and other medications as indicated.  Although tearful he is agreeing to contract for safety.  The plan is to initiate Abilify with the first dose now and continue with  the agitation protocol.  We will obtain additional collateral information as indicated. Associated Signs/Symptoms: Depression Symptoms:  depressed mood, anhedonia, insomnia, psychomotor agitation, psychomotor retardation, difficulty concentrating, suicidal thoughts without plan, weight loss, (Hypo) Manic Symptoms:  Distractibility, Elevated Mood, Flight of  Ideas, Impulsivity, Irritable Mood, Labiality of Mood, Sexually Inapproprite Behavior, Anxiety Symptoms:  Social Anxiety, Psychotic Symptoms:  Delusions, Ideas of Reference, PTSD Symptoms: Negative Total Time spent with patient: 30 minutes  Past Psychiatric History: Past psychiatric history is positive for history of bipolar disorder, methamphetamine use disorder and suicidal ideations.  He was hospitalized at least twice once to [facility].  The second admission was at [facility] in [place].  He was tried on Depakote and lithium.  He is currently on no medications.  Is the patient at risk to self? Yes.    Has the patient been a risk to self in the past 6 months? Yes.    Has the patient been a risk to self within the distant past? Yes.    Is the patient a risk to others? Yes.    Has the patient been a risk to others in the past 6 months? Yes.    Has the patient been a risk to others within the distant past? Yes.     [place] Scale:  Flowsheet Row [facility] ([facility]) [facility] from 07/31/2023 in [facility] Department at [facility] ED from 07/23/2023 in [facility] Department at [facility]  C-SSRS RISK CATEGORY No Risk No Risk No Risk        Prior Inpatient Therapy: Yes.   If yes, describe as noted above admitted to [facility], [facility] but none. Prior Outpatient Therapy: No. If yes, describe noncompliant.  Alcohol Screening: Patient refused Alcohol Screening Tool: Yes 1. How often do you have a drink containing alcohol?: Never 2. How many drinks containing alcohol do you have on a typical day when you are drinking?: 1 or 2 3. How often do you have six or more drinks on one occasion?: Never AUDIT-C Score: 0 4. How often during the last year have you found that you were not able to stop drinking once you had started?: Never 5. How often during the last year have  you failed to do what was normally expected from you because of drinking?: Never 6. How often during the last year have you needed a first drink in the morning to get yourself going after a heavy drinking session?: Never 7. How often during the last year have you had a feeling of guilt of remorse after drinking?: Never 8. How often during the last year have you been unable to remember what happened the night before because you had been drinking?: Never 9. Have you or someone else been injured as a result of your drinking?: No 10. Has a relative or friend or a doctor or another health worker been concerned about your drinking or suggested you cut down?: No Alcohol Use Disorder Identification Test Final Score (AUDIT): 0 Substance Abuse History in the last 12 months:  Yes.   Consequences of Substance Abuse: Acute paranoia, mood swings and suicidal or homicidal ideations. Previous Psychotropic Medications: Yes  Psychological Evaluations: Yes  Past Medical History: History reviewed. No pertinent past medical history. History reviewed. No pertinent surgical history. Family History: History reviewed. No pertinent family history. Family Psychiatric  History: Unknown at this time. Tobacco Screening:  Social History   Tobacco  Use  Smoking Status Never  Smokeless Tobacco Never    BH Tobacco Counseling     Are you interested in Tobacco Cessation Medications?  No value filed. Counseled patient on smoking cessation:  No value filed. Reason Tobacco Screening Not Completed: No value filed.       Social History:  Social History   Substance and Sexual Activity  Alcohol Use None     Social History   Substance and Sexual Activity  Drug Use Never    Additional Social History:                           Allergies:  No Known Allergies Lab Results:  Results for orders placed or performed during the hospital encounter of 07/31/23 (from the past 48 hour(s))  Comprehensive metabolic  panel     Status: Abnormal   Collection Time: 07/31/23  2:34 AM  Result Value Ref Range   Sodium 138 135 - 145 mmol/L   Potassium 3.7 3.5 - 5.1 mmol/L   Chloride 102 98 - 111 mmol/L   CO2 27 22 - 32 mmol/L   Glucose, Bld 110 (H) 70 - 99 mg/dL    Comment: Glucose reference range applies only to samples taken after fasting for at least 8 hours.   BUN 12 6 - 20 mg/dL   Creatinine, Ser 8.46 0.61 - 1.24 mg/dL   Calcium 9.2 8.9 - 96.2 mg/dL   Total Protein 7.4 6.5 - 8.1 g/dL   Albumin 4.7 3.5 - 5.0 g/dL   AST 19 15 - 41 U/L   ALT 14 0 - 44 U/L   Alkaline Phosphatase 65 38 - 126 U/L   Total Bilirubin 0.9 0.3 - 1.2 mg/dL   GFR, Estimated >95 >28 mL/min    Comment: (NOTE) Calculated using the CKD-EPI Creatinine Equation (2021)    Anion gap 9 5 - 15    Comment: Performed at [facility], [address] [place], [place] [address]  Ethanol     Status: None   Collection Time: 07/31/23  2:34 AM  Result Value Ref Range   Alcohol, Ethyl (B) <10 <10 mg/dL    Comment: (NOTE) Lowest detectable limit for serum alcohol is 10 mg/dL.  For medical purposes only. Performed at [facility], [address] [place], [place] [address]   Salicylate level     Status: Abnormal   Collection Time: 07/31/23  2:34 AM  Result Value Ref Range   Salicylate Lvl <7.0 (L) 7.0 - 30.0 mg/dL    Comment: Performed at [facility], [address] [place], [place] [address]  Acetaminophen level     Status: Abnormal   Collection Time: 07/31/23  2:34 AM  Result Value Ref Range   Acetaminophen (Tylenol), Serum <10 (L) 10 - 30 ug/mL    Comment: (NOTE) Therapeutic concentrations vary significantly. A range of 10-30 ug/mL  may be an effective concentration for many patients. However, some  are best treated at concentrations outside of this range. Acetaminophen concentrations >150 ug/mL at 4 hours after ingestion  and >50 ug/mL at 12 hours after ingestion are often associated with  toxic  reactions.  Performed at [facility], [address] [place], [place] [address]   cbc     Status: Abnormal   Collection Time: 07/31/23  2:34 AM  Result Value Ref Range   WBC 10.7 (H) 4.0 - 10.5 K/uL   RBC 4.90 4.22 - 5.81  MIL/uL   Hemoglobin 15.1 13.0 - 17.0 g/dL   HCT 84.6 96.2 - 95.2 %   MCV 90.6 80.0 - 100.0 fL   MCH 30.8 26.0 - 34.0 pg   MCHC 34.0 30.0 - 36.0 g/dL   RDW 84.1 32.4 - 40.1 %   Platelets 313 150 - 400 K/uL   nRBC 0.0 0.0 - 0.2 %    Comment: Performed at [facility], [address], [place], [place] [address]  Urine Drug Screen, Qualitative     Status: Abnormal   Collection Time: 07/31/23  8:08 AM  Result Value Ref Range   Tricyclic, Ur Screen NONE DETECTED NONE DETECTED   Amphetamines, Ur Screen POSITIVE (A) NONE DETECTED   MDMA (Ecstasy)Ur Screen NONE DETECTED NONE DETECTED   Cocaine Metabolite,Ur [place] NONE DETECTED NONE DETECTED   Opiate, Ur Screen NONE DETECTED NONE DETECTED   Phencyclidine (PCP) Ur S NONE DETECTED NONE DETECTED   Cannabinoid 50 Ng, Ur [place] POSITIVE (A) NONE DETECTED   Barbiturates, Ur Screen NONE DETECTED NONE DETECTED   Benzodiazepine, Ur Scrn NONE DETECTED NONE DETECTED   Methadone Scn, Ur NONE DETECTED NONE DETECTED    Comment: (NOTE) Tricyclics + metabolites, urine    Cutoff 1000 ng/mL Amphetamines + metabolites, urine  Cutoff 1000 ng/mL MDMA (Ecstasy), urine              Cutoff 500 ng/mL Cocaine Metabolite, urine          Cutoff 300 ng/mL Opiate + metabolites, urine        Cutoff 300 ng/mL Phencyclidine (PCP), urine         Cutoff 25 ng/mL Cannabinoid, urine                 Cutoff 50 ng/mL Barbiturates + metabolites, urine  Cutoff 200 ng/mL Benzodiazepine, urine              Cutoff 200 ng/mL Methadone, urine                   Cutoff 300 ng/mL  The urine drug screen provides only a preliminary, unconfirmed analytical test result and should not be used for non-medical purposes. Clinical consideration and  professional judgment should be applied to any positive drug screen result due to possible interfering substances. A more specific alternate chemical method must be used in order to obtain a confirmed analytical result. Gas chromatography / mass spectrometry (GC/MS) is the preferred confirm atory method. Performed at [facility], [address] [place], [place] [address]     Blood Alcohol level:  Lab Results  Component Value Date   [facility] <10 07/31/2023    Metabolic Disorder Labs:  No results found for: "HGBA1C", "MPG" No results found for: "PROLACTIN" No results found for: "CHOL", "TRIG", "HDL", "CHOLHDL", "VLDL", "LDLCALC"  Current Medications: Current Facility-Administered Medications  Medication Dose Route Frequency Provider Last Rate Last Admin   acetaminophen (TYLENOL) tablet 650 mg  650 mg Oral Q6H PRN Dixon, Elray Buba, NP       alum & mag hydroxide-simeth (MAALOX/MYLANTA) 200-200-20 MG/5ML suspension 30 mL  30 mL Oral Q4H PRN Dixon, Rashaun M, NP       diphenhydrAMINE (BENADRYL) capsule 50 mg  50 mg Oral TID PRN Jearld Lesch, NP       Or   diphenhydrAMINE (BENADRYL) injection 50 mg  50 mg Intramuscular TID PRN Jearld Lesch, NP       haloperidol (HALDOL) tablet 5 mg  5 mg  Oral TID PRN Jearld Lesch, NP       Or   haloperidol lactate (HALDOL) injection 5 mg  5 mg Intramuscular TID PRN Jearld Lesch, NP       hydrOXYzine (ATARAX) tablet 25 mg  25 mg Oral TID PRN Jearld Lesch, NP       hydrOXYzine (ATARAX) tablet 25 mg  25 mg Oral TID PRN Jearld Lesch, NP       LORazepam (ATIVAN) tablet 2 mg  2 mg Oral TID PRN Jearld Lesch, NP       Or   LORazepam (ATIVAN) injection 2 mg  2 mg Intramuscular TID PRN Jearld Lesch, NP       risperiDONE (RISPERDAL M-TABS) disintegrating tablet 2 mg  2 mg Oral Q8H PRN Charm Rings, NP       And   LORazepam (ATIVAN) tablet 1 mg  1 mg Oral PRN Charm Rings, NP       And   ziprasidone (GEODON)  injection 20 mg  20 mg Intramuscular PRN Charm Rings, NP       magnesium hydroxide (MILK OF MAGNESIA) suspension 30 mL  30 mL Oral Daily PRN Jearld Lesch, NP       nicotine polacrilex (NICORETTE) gum 2 mg  2 mg Oral PRN Sarita Bottom, MD       OLANZapine (ZYPREXA) tablet 2.5 mg  2.5 mg Oral BID Dixon, Rashaun M, NP       traZODone (DESYREL) tablet 50 mg  50 mg Oral QHS Dixon, Rashaun M, NP       traZODone (DESYREL) tablet 50 mg  50 mg Oral QHS PRN Jearld Lesch, NP       PTA Medications: Medications Prior to Admission  Medication Sig Dispense Refill Last Dose   DESCOVY 200-25 MG tablet Take 1 tablet by mouth daily. (Patient not taking: Reported on 07/31/2023)      divalproex (DEPAKOTE ER) 500 MG 24 hr tablet Take 1,500 mg by mouth daily.      lithium carbonate (LITHOBID) 300 MG ER tablet Take 900 mg by mouth at bedtime.      sertraline (ZOLOFT) 50 MG tablet Take 50 mg by mouth daily.       Musculoskeletal: Strength & Muscle Tone: within normal limits Gait & Station: normal Patient leans: N/A            Psychiatric Specialty Exam:  Presentation  General Appearance:  Appropriate for Environment; Casual; Disheveled  Eye Contact: Fair  Speech: Clear and Coherent; Slow  Speech Volume: Decreased  Handedness: Right   Mood and Affect  Mood: Anxious; Depressed  Affect: Tearful; Restricted   Thought Process  Thought Processes: Linear  Duration of Psychotic Symptoms:N/A Past Diagnosis of Schizophrenia or Psychoactive disorder: No  Descriptions of Associations:Intact  Orientation:Full (Time, Place and Person)  Thought Content:Rumination; Perseveration  Hallucinations:Hallucinations: None  Ideas of Reference:None  Suicidal Thoughts:Suicidal Thoughts: Yes, Passive SI Passive Intent and/or Plan: Without Intent; Without Plan  Homicidal Thoughts:Homicidal Thoughts: Yes, Passive   Sensorium  Memory: Immediate Fair; Recent Fair; Remote  Fair  Judgment: Poor  Insight: Poor   Executive Functions  Concentration: Poor  Attention Span: Fair  Recall: [employer] of Knowledge: Fair  Language: Fair   Psychomotor Activity  Psychomotor Activity: Psychomotor Activity: Normal   Assets  Assets: Manufacturing systems engineer; Desire for Improvement; Resilience   Sleep  Sleep: Sleep: Fair    Physical Exam: Physical Exam Constitutional:  Appearance: Normal appearance.  Neurological:     General: No focal deficit present.     Mental Status: He is alert and oriented to person, place, and time. Mental status is at baseline.    Review of Systems  Psychiatric/Behavioral:  Positive for depression, substance abuse and suicidal ideas. The patient is nervous/anxious.    Blood pressure 106/71, pulse 99, temperature 98.6 F (37 C), temperature source Oral, resp. rate 16, height 5\' 7"  (1.702 m), weight 55.3 kg, SpO2 100%. Body mass index is 19.11 kg/m.  Treatment Plan Summary: Daily contact with patient to assess and evaluate symptoms and progress in treatment, Medication management, and Plan the patient is going to be assessed on a daily basis for any SI/HI/AVH.  Medications will be initiated as appropriate.  Observation Level/Precautions:  15 minute checks  Laboratory:  CBC Chemistry Profile HbAIC HCG  Psychotherapy: Individual and group.  Medications: Patient has agreed to take Abilify.  Consultations: None  Discharge Concerns: Unstable environment  Estimated LOS: 4 to 5 days.  Other:     Physician Treatment Plan for Primary Diagnosis: Depression Long Term Goal(s): Improvement in symptoms so as ready for discharge  Short Term Goals: Ability to identify changes in lifestyle to reduce recurrence of condition will improve, Ability to verbalize feelings will improve, Ability to disclose and discuss suicidal ideas, Ability to demonstrate self-control will improve, Ability to identify and develop effective  coping behaviors will improve, and Ability to maintain clinical measurements within normal limits will improve  Physician Treatment Plan for Secondary Diagnosis: Principal Problem:   Depression  Long Term Goal(s): Improvement in symptoms so as ready for discharge  Short Term Goals: Ability to verbalize feelings will improve, Ability to identify and develop effective coping behaviors will improve, Ability to maintain clinical measurements within normal limits will improve, and Compliance with prescribed medications will improve  I certify that inpatient services furnished can reasonably be expected to improve the patient's condition.    Rex Kras, MD 9/21/202412:13 PM

## 2023-08-01 NOTE — Progress Notes (Signed)
   08/01/23 2330  Psych Admission Type (Psych Patients Only)  Admission Status Involuntary  Psychosocial Assessment  Patient Complaints Anxiety;Depression;Nervousness  Eye Contact Fair  Facial Expression Anxious  Affect Anxious  Speech Logical/coherent  Interaction Assertive  Motor Activity Fidgety;Restless  Appearance/Hygiene In scrubs  Behavior Characteristics Anxious  Mood Anxious;Irritable  Thought Process  Coherency WDL  Content Blaming others  Delusions None reported or observed  Perception WDL  Hallucination None reported or observed  Judgment Impaired  Confusion WDL  Danger to Self  Current suicidal ideation? Denies  Danger to Others  Danger to Others None reported or observed

## 2023-08-01 NOTE — [facility] Suicide Risk Assessment (Signed)
[facility] Admission Suicide Risk Assessment   Nursing information obtained from:    Demographic factors:  Male, Caucasian, Cardell Peach, lesbian, or bisexual orientation Current Mental Status:  Plan to harm others Loss Factors:  Financial problems / change in socioeconomic status Historical Factors:  Impulsivity Risk Reduction Factors:  Living with another person, especially a relative  Total Time spent with patient: 30 minutes Principal Problem: Depression Diagnosis:  Principal Problem:   Depression  Subjective Data: 22 year old Caucasian male with a history of bipolar depression and methamphetamine use disorder who was admitted on an IVC with suicidal ideations and homicidal ideations.  Continued Clinical Symptoms:  Alcohol Use Disorder Identification Test Final Score (AUDIT): 0 The "Alcohol Use Disorders Identification Test", Guidelines for Use in Primary Care, Second Edition.  World Science writer [facility]). Score between 0-7:  no or low risk or alcohol related problems. Score between 8-15:  moderate risk of alcohol related problems. Score between 16-19:  high risk of alcohol related problems. Score 20 or above:  warrants further diagnostic evaluation for alcohol dependence and treatment.   CLINICAL FACTORS:   Bipolar Disorder:   Bipolar II Mixed State Personality Disorders:   Cluster B Comorbid depression More than one psychiatric diagnosis   Musculoskeletal: Strength & Muscle Tone: within normal limits Gait & Station: normal Patient leans: N/A  Psychiatric Specialty Exam:  Presentation  General Appearance:  Appropriate for Environment  Eye Contact: Fair  Speech: Normal Rate  Speech Volume: Decreased  Handedness: Right   Mood and Affect  Mood: Dysphoric; Anxious  Affect: Congruent   Thought Process  Thought Processes: Coherent  Descriptions of Associations:Intact  Orientation:Full (Time, Place and Person)  Thought Content:Logical  History of  Schizophrenia/Schizoaffective disorder:No  Duration of Psychotic Symptoms:No data recorded Hallucinations:Hallucinations: None  Ideas of Reference:None  Suicidal Thoughts:Suicidal Thoughts: Yes, Passive SI Passive Intent and/or Plan: Without Intent; Without Plan  Homicidal Thoughts:Homicidal Thoughts: No   Sensorium  Memory:No data recorded Judgment: Poor  Insight: Poor   Executive Functions  Concentration: Good  Attention Span: Good  Recall: Good  Fund of Knowledge: Good  Language: Good   Psychomotor Activity  Psychomotor Activity: Psychomotor Activity: Tremor   Assets  Assets: Desire for Improvement; Communication Skills   Sleep  Sleep: Sleep: Fair    Physical Exam: Physical Exam HENT:     Head: Normocephalic.  Neurological:     General: No focal deficit present.     Mental Status: He is alert and oriented to person, place, and time. Mental status is at baseline.    Review of Systems  Psychiatric/Behavioral:  Positive for depression, substance abuse and suicidal ideas. The patient is nervous/anxious.    Blood pressure 106/71, pulse 99, temperature 98.6 F (37 C), temperature source Oral, resp. rate 16, height 5\' 7"  (1.702 m), weight 55.3 kg, SpO2 100%. Body mass index is 19.11 kg/m.   COGNITIVE FEATURES THAT CONTRIBUTE TO RISK:  Polarized thinking and Thought constriction (tunnel vision)    SUICIDE RISK:   Moderate:  Frequent suicidal ideation with limited intensity, and duration, some specificity in terms of plans, no associated intent, good self-control, limited dysphoria/symptomatology, some risk factors present, and identifiable protective factors, including available and accessible social support.  PLAN OF CARE: The patient is admitted to the safe and secure environment.  He has been placed on the 500 floor and his IVC has been upheld.  We will initiate medications as indicated.  I certify that inpatient services furnished can  reasonably be expected to improve the  patient's condition.   Rex Kras, MD 08/01/2023, 12:05 PM

## 2023-08-01 NOTE — Progress Notes (Signed)
Adult Psychoeducational Group Note  Date:  08/01/2023 Time:  8:30 PM  Group Topic/Focus:  Wrap-Up Group:   The focus of this group is to help patients review their daily goal of treatment and discuss progress on daily workbooks.  Participation Level:  Active  Participation Quality:  Appropriate  Affect:  Appropriate  Cognitive:  Appropriate  Insight: Appropriate  Engagement in Group:  Engaged  Modes of Intervention:  Discussion  Additional Comments:  Pt stated his day was great. Pt goal for this hospital stay is to get and feel better.  Wynema Birch D 08/01/2023, 8:30 PM

## 2023-08-01 NOTE — Tx Team (Signed)
Initial Treatment Plan 08/01/2023 12:09 PM Ronald Leach WGN:[id number]    PATIENT STRESSORS: Educational concerns   Financial difficulties   Marital or family conflict   Substance abuse     PATIENT STRENGTHS: Communication skills  Physical Health  Special hobby/interest  Supportive family/friends    PATIENT IDENTIFIED PROBLEMS: "At risk for suicide"   "Depression"   "Anxiety"   "Substance use"   "Relationship discord"   "Educational concerns"            DISCHARGE CRITERIA:  Ability to meet basic life and health needs Improved stabilization in mood, thinking, and/or behavior Verbal commitment to aftercare and medication compliance Withdrawal symptoms are absent or subacute and managed without 24-hour nursing intervention  PRELIMINARY DISCHARGE PLAN: Outpatient therapy Placement in alternative living arrangements Return to previous living arrangement Return to previous work or school arrangements  PATIENT/FAMILY INVOLVEMENT: This treatment plan has been presented to and reviewed with the patient, Ronald Leach, and/or family member.  The patient and family have been given the opportunity to ask questions and make suggestions.  Tyrone Apple, RN 08/01/2023, 12:09 PM

## 2023-08-01 NOTE — ED Provider Notes (Signed)
Emergency Medicine Observation Re-evaluation Note  Ronald Leach is a 22 y.o. male, seen on rounds today.  Pt initially presented to the ED for complaints of Psychiatric Evaluation (/) Currently, the patient is resting, voices no medical complaints.  Physical Exam  BP 118/74 (BP Location: Right Arm)   Pulse 71   Temp (!) 97.4 F (36.3 C) (Oral)   Resp 18   Ht 5\' 7"  (1.702 m)   Wt 55.3 kg   SpO2 100%   BMI 19.11 kg/m  Physical Exam General: Resting in no acute distress Cardiac: No cyanosis Lungs: Equal rise and fall Psych: Not agitated  ED Course / MDM  EKG:   I have reviewed the labs performed to date as well as medications administered while in observation.  Recent changes in the last 24 hours include no events overnight.  Plan  Current plan is for psychiatric disposition.    Irean Hong, MD 08/01/23 0530

## 2023-08-01 NOTE — ED Notes (Signed)
Patient has been accepted to [facility].  Patient assigned to room 300-02 Accepting physician is Dr. Abbott Pao.  Call report to [phone].  Representative was Antionette Cillo.   ER Staff is aware of it:  [facility] ER Secretary  Dr. Larinda Buttery, ER MD  Victorino Dike Patient's Nurse

## 2023-08-01 NOTE — Progress Notes (Signed)
Admit Note:  Ronald Leach. Ave (Prefers to be called Ronald Leach) is a 22 year old presented IVC to [facility] with PMH hx of bipolar affective disorder. 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 Pt admitted that his meth use is worsening and increasing in frequency. Pt states he has been using daily for 3 months, Pt was unable to specify amount used. Pt reported that he'd been arguing more frequent with his boyfriend, they have been together for 4 months. Pt admits that he made statements to the boyfriend threatening to harm himself and threatening to harm the boyfriend. Pt denies AVH. Pt reports he lives with his father and is currently unemployed. Pt reports that he is currently on academic probation at [employer] and is working towards being able to re-enroll. He is not currently seeing a psychiatrist or therapist and reports non-compliance with Lithium. UDS + amphetamines and THC. Pt oriented to unit rules and procedures. Skin was assessed and found to be WNL with the exception of bug bites on right foot and skin tear form tape on left forearm. Belongings in locker #6.  Pt signed consents. Snacks and fluids offered in which Pt accepted. Pt is able to verbal contract for safety. Pt refusing flu vaccine but is requesting nicotine gum. Pt remains safe.

## 2023-08-01 NOTE — Progress Notes (Signed)
Pt was accepted to [facility] TODAY9/21/2024; Bed Assignment 300-02 PENDING IVC uploaded to chart or faxed to [facility] [phone]  Dx is substance induced mood d/o. Please ensure that IVC is uploaded prior to transfer.   Pt meets inpatient criteria per Lajoyce Corners, NP    Attending Physician will be Dr. Sarita Bottom, MD   Report can be called to: -Adult unit: [phone]  Pt can arrive after: BED IS READY NOW  Care Team notified:Day [facility] Antoinette Alexandria, RN, Trooper Reiser,RN, Ronnie Helane Gunther, Counselor, [place], Registration Jocelyn Ova [place] Lynnae January.RN, Night [facility] Gretta Arab, RN, [employer], [place] [facility] [address]   [place], [place] 08/01/2023 @ 10:33 AM

## 2023-08-02 DIAGNOSIS — F333 Major depressive disorder, recurrent, severe with psychotic symptoms: Secondary | ICD-10-CM | POA: Diagnosis not present

## 2023-08-02 MED ORDER — WHITE PETROLATUM EX OINT
TOPICAL_OINTMENT | CUTANEOUS | Status: AC
  Administered 2023-08-02: 1
  Filled 2023-08-02: qty 5

## 2023-08-02 NOTE — Progress Notes (Signed)
Psychiatric Admission Assessment Adult  Patient Identification: Ronald Leach MRN:  [id number] Date of Evaluation:  08/02/2023 Chief Complaint:  Depression [F32.A] Principal Diagnosis: Depression Diagnosis:  Principal Problem:   Depression  Reason for admission   The patient is a 22 year old Caucasian male with a long past history of bipolar disorder, methamphetamine use disorder and suicidal ideations who was admitted on an IVC initiated by his aunt.  Chart review from last 24 hours   Staff reports that the patient has been compliant with scheduled medications and took Abilify 5 mg yesterday after admission.  He refused the trazodone at night.  He is withdrawn and frustrated but in general compliant.  He received as needed hydroxyzine once.  Yesterday, the psychiatry team made the following recommendations:  To begin Abilify 5 mg a day and increase as tolerated.  Consider a mood stabilizer.  Information obtained during interview   The patient was seen and evaluated today and the chart was reviewed.  He was lying in bed and was fairly alert oriented and cooperative but somewhat frustrated and withdrawn.  His speech was coherent with some latency but no obvious looseness of associations or flight of ideas.  Patient continues to endorse frustration at being here and he blames himself for being here.  He admits that he was on methamphetamine but he thinks and tries to rationalize that methamphetamine helps him with his mood and that meth works different in different people and for him it does not make him paranoid.  However he was noted to be clearly paranoid.  This is better today.  Patient admits to a past history of trials on Depakote and lithium and he felt that they have a not optimal and he does not want to restart that.  However his aunt and uncle both are diagnosed as having bipolar disorder.  He is willing to take Abilify and consider other medications as appropriate. He would like to  be discharged soon because he claims that he is only on academic probation, he also wants to get a place of his own. He denies any active SI/HI/AVH.  He denies mood swings.     Associated Signs/Symptoms: Depression Symptoms:  depressed mood, anhedonia, insomnia, psychomotor agitation, psychomotor retardation, difficulty concentrating, suicidal thoughts without plan, weight loss, (Hypo) Manic Symptoms:  Distractibility, Elevated Mood, Flight of Ideas, Impulsivity, Irritable Mood, Labiality of Mood, Sexually Inapproprite Behavior, Anxiety Symptoms:  Social Anxiety, Psychotic Symptoms:  Delusions, Ideas of Reference, PTSD Symptoms: Negative Total Time spent with patient: 30 minutes  Past Psychiatric History: Past psychiatric history is positive for history of bipolar disorder, methamphetamine use disorder and suicidal ideations.  He was hospitalized at least twice once to [facility].  The second admission was at [facility] in [place].  He was tried on Depakote and lithium.  He is currently on no medications.  Is the patient at risk to self? Yes.    Has the patient been a risk to self in the past 6 months? Yes.    Has the patient been a risk to self within the distant past? Yes.    Is the patient a risk to others? Yes.    Has the patient been a risk to others in the past 6 months? Yes.    Has the patient been a risk to others within the distant past? Yes.     [place] Scale:  Flowsheet Row [facility]) [facility] from [facility]2023 [facility] ED from 07/23/2023 in [facility] at [facility]  C-SSRS RISK CATEGORY No Risk No Risk No Risk        Prior Inpatient Therapy: Yes.   If yes, describe as noted above admitted to [facility] but none. Prior Outpatient Therapy: No. If yes, describe  noncompliant.  Alcohol Screening: Patient refused Alcohol Screening Tool: Yes 1. How often do you have a drink containing alcohol?: Never 2. How many drinks containing alcohol do you have on a typical day when you are drinking?: 1 or 2 3. How often do you have six or more drinks on one occasion?: Never AUDIT-C Score: 0 4. How often during the last year have you found that you were not able to stop drinking once you had started?: Never 5. How often during the last year have you failed to do what was normally expected from you because of drinking?: Never 6. How often during the last year have you needed a first drink in the morning to get yourself going after a heavy drinking session?: Never 7. How often during the last year have you had a feeling of guilt of remorse after drinking?: Never 8. How often during the last year have you been unable to remember what happened the night before because you had been drinking?: Never 9. Have you or someone else been injured as a result of your drinking?: No 10. Has a relative or friend or a doctor or another health worker been concerned about your drinking or suggested you cut down?: No Alcohol Use Disorder Identification Test Final Score (AUDIT): 0 Substance Abuse History in the last 12 months:  Yes.   Consequences of Substance Abuse: Acute paranoia, mood swings and suicidal or homicidal ideations. Previous Psychotropic Medications: Yes  Psychological Evaluations: Yes  Past Medical History: History reviewed. No pertinent past medical history. History reviewed. No pertinent surgical history. Family History: History reviewed. No pertinent family history. Family Psychiatric  History: Unknown at this time. Tobacco Screening:  Social History   Tobacco Use  Smoking Status Never  Smokeless Tobacco Never    BH Tobacco Counseling     Are you interested in Tobacco Cessation Medications?  No value filed. Counseled patient on smoking cessation:  No value  filed. Reason Tobacco Screening Not Completed: No value filed.       Social History:  Social History   Substance and Sexual Activity  Alcohol Use None     Social History   Substance and Sexual Activity  Drug Use Never    Additional Social History: Are you sexually active?: Yes What is your sexual orientation?: gay Does patient have children?: No                         Allergies:  No Known Allergies Lab Results:  Results for orders placed or performed during the hospital encounter of 08/01/23 (from the past 48 hour(s))  Basic metabolic panel     Status: None   Collection Time: 08/01/23  6:32 PM  Result Value Ref Range   Sodium 138 135 - 145 mmol/L   Potassium 4.4 3.5 - 5.1 mmol/L   Chloride 100 98 - 111 mmol/L   CO2 29 22 - 32 mmol/L   Glucose, Bld 86 70 - 99 mg/dL    Comment: Glucose reference range applies only to samples taken after fasting for at least 8 hours.   BUN 13  6 - 20 mg/dL   Creatinine, Ser 0.98 0.61 - 1.24 mg/dL   Calcium 9.3 8.9 - 11.9 mg/dL   GFR, Estimated >14 >78 mL/min    Comment: (NOTE) Calculated using the CKD-EPI Creatinine Equation (2021)    Anion gap 9 5 - 15    Comment: Performed at [facility], [address] [place], [place] [address]    Blood Alcohol level:  Lab Results  Component Value Date   ETH <10 07/31/2023    Metabolic Disorder Labs:  No results found for: "HGBA1C", "MPG" No results found for: "PROLACTIN" No results found for: "CHOL", "TRIG", "HDL", "CHOLHDL", "VLDL", "LDLCALC"  Current Medications: Current Facility-Administered Medications  Medication Dose Route Frequency Provider Last Rate Last Admin   acetaminophen (TYLENOL) tablet 650 mg  650 mg Oral Q6H PRN Dixon, Rashaun M, NP       alum & mag hydroxide-simeth (MAALOX/MYLANTA) 200-200-20 MG/5ML suspension 30 mL  30 mL Oral Q4H PRN Dixon, Rashaun M, NP       ARIPiprazole (ABILIFY) tablet 5 mg  5 mg Oral Daily Rex Kras, MD   5  mg at 08/02/23 0810   diphenhydrAMINE (BENADRYL) capsule 50 mg  50 mg Oral TID PRN Jearld Lesch, NP       Or   diphenhydrAMINE (BENADRYL) injection 50 mg  50 mg Intramuscular TID PRN Jearld Lesch, NP       haloperidol (HALDOL) tablet 5 mg  5 mg Oral TID PRN Jearld Lesch, NP       Or   haloperidol lactate (HALDOL) injection 5 mg  5 mg Intramuscular TID PRN Jearld Lesch, NP       hydrOXYzine (ATARAX) tablet 25 mg  25 mg Oral TID PRN Jearld Lesch, NP       hydrOXYzine (ATARAX) tablet 25 mg  25 mg Oral TID PRN Jearld Lesch, NP   25 mg at 08/01/23 1307   LORazepam (ATIVAN) tablet 2 mg  2 mg Oral TID PRN Jearld Lesch, NP       Or   LORazepam (ATIVAN) injection 2 mg  2 mg Intramuscular TID PRN Jearld Lesch, NP       risperiDONE (RISPERDAL M-TABS) disintegrating tablet 2 mg  2 mg Oral Q8H PRN Charm Rings, NP       And   LORazepam (ATIVAN) tablet 1 mg  1 mg Oral PRN Charm Rings, NP       And   ziprasidone (GEODON) injection 20 mg  20 mg Intramuscular PRN Charm Rings, NP       magnesium hydroxide (MILK OF MAGNESIA) suspension 30 mL  30 mL Oral Daily PRN Jearld Lesch, NP       nicotine polacrilex (NICORETTE) gum 2 mg  2 mg Oral PRN Sarita Bottom, MD       traZODone (DESYREL) tablet 50 mg  50 mg Oral QHS Dixon, Rashaun M, NP       traZODone (DESYREL) tablet 50 mg  50 mg Oral QHS PRN Jearld Lesch, NP       PTA Medications: Medications Prior to Admission  Medication Sig Dispense Refill Last Dose   DESCOVY 200-25 MG tablet Take 1 tablet by mouth daily. (Patient not taking: Reported on 07/31/2023)      divalproex (DEPAKOTE ER) 500 MG 24 hr tablet Take 1,500 mg by mouth daily.      lithium carbonate (LITHOBID) 300 MG ER tablet Take 900 mg  by mouth at bedtime.      sertraline (ZOLOFT) 50 MG tablet Take 50 mg by mouth daily.       Musculoskeletal: Strength & Muscle Tone: within normal limits Gait & Station: normal Patient leans:  N/A            Psychiatric Specialty Exam:  Presentation  General Appearance:  Casual; Disheveled  Eye Contact: Fair  Speech: Slow  Speech Volume: Decreased  Handedness: Right   Mood and Affect  Mood: Anxious; Depressed; Irritable  Affect: Blunt; Constricted   Thought Process  Thought Processes: Linear  Duration of Psychotic Symptoms:N/A Past Diagnosis of Schizophrenia or Psychoactive disorder: No  Descriptions of Associations:Intact  Orientation:Full (Time, Place and Person)  Thought Content:Perseveration; Rumination  Hallucinations:Hallucinations: None  Ideas of Reference:Paranoia  Suicidal Thoughts:Suicidal Thoughts: Yes, Passive  Homicidal Thoughts:Homicidal Thoughts: Yes, Passive   Sensorium  Memory: Immediate Fair; Remote Fair; Recent Fair  Judgment: Poor  Insight: Poor   Executive Functions  Concentration: Fair  Attention Span: Fair  Recall: [employer] of Knowledge: Fair  Language: Fair   Psychomotor Activity  Psychomotor Activity: Psychomotor Activity: Normal   Assets  Assets: Desire for Improvement; Communication Skills; Resilience   Sleep  Sleep: Sleep: Good Number of Hours of Sleep: 7.25    Physical Exam: Physical Exam Constitutional:      Appearance: Normal appearance.  Neurological:     General: No focal deficit present.     Mental Status: He is alert and oriented to person, place, and time. Mental status is at baseline.    Review of Systems  Psychiatric/Behavioral:  Positive for depression, substance abuse and suicidal ideas. The patient is nervous/anxious.    Blood pressure 112/82, pulse 97, temperature 97.9 F (36.6 C), temperature source Oral, resp. rate 16, height 5\' 7"  (1.702 m), weight 55.3 kg, SpO2 100%. Body mass index is 19.11 kg/m.  Treatment Plan Summary: Daily contact with patient to assess and evaluate symptoms and progress in treatment, Medication management, and Plan  the patient is going to be assessed on a daily basis for any SI/HI/AVH.  Medications will be initiated as appropriate.  Observation Level/Precautions:  15 minute checks  Laboratory:  CBC Chemistry Profile HbAIC HCG  Psychotherapy: Individual and group.  Medications: Continue Abilify 5 mg a day and increase as tolerated.  Consultations: None  Discharge Concerns: Unstable environment  Estimated LOS: 4 to 5 days.  Other: Collateral pending at this time.   Physician Treatment Plan for Primary Diagnosis: Depression Long Term Goal(s): Improvement in symptoms so as ready for discharge  Short Term Goals: Ability to identify changes in lifestyle to reduce recurrence of condition will improve, Ability to verbalize feelings will improve, Ability to disclose and discuss suicidal ideas, Ability to demonstrate self-control will improve, Ability to identify and develop effective coping behaviors will improve, and Ability to maintain clinical measurements within normal limits will improve  Physician Treatment Plan for Secondary Diagnosis: Principal Problem:   Depression  Long Term Goal(s): Improvement in symptoms so as ready for discharge  Short Term Goals: Ability to verbalize feelings will improve, Ability to identify and develop effective coping behaviors will improve, Ability to maintain clinical measurements within normal limits will improve, and Compliance with prescribed medications will improve  I certify that inpatient services furnished can reasonably be expected to improve the patient's condition.    Rex Kras, MD 9/22/20242:07 PM Patient ID: Ronald Leach, male   DOB: Mar 24, 2001, 22 y.o.   MRN: [id number]

## 2023-08-02 NOTE — Plan of Care (Signed)
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
## 2023-08-02 NOTE — [facility] Group Notes (Signed)
[facility] Group Notes:  (Nursing/MHT/Case Management/Adjunct)  Date:  08/02/2023  Time:  2:28 PM  Type of Therapy:  Psychoeducational Skills  Participation Level:  Active  Participation Quality:  disorganized  Affect:  labile  Cognitive:  disorganized  Insight:  improved  Engagement in Group:  engaged  Modes of Intervention:  Discussion, Education, and Exploration  Summary of Progress/Problems:  Mental health wellness podcast played from [place], regarding tips for promoting mental well being and healthy relationships.Pt attended late, appeared to sleep part of the time during group. thought content was tangential and somewhat disorganized.

## 2023-08-02 NOTE — Plan of Care (Signed)
Problem: Education: Goal: Knowledge of [facility] information/materials will improve Outcome: Progressing Goal: Emotional status will improve Outcome: Progressing Goal: Mental status will improve Outcome: Progressing Goal: Verbalization of understanding the information provided will improve Outcome: Progressing   Problem: Activity: Goal: Interest or engagement in activities will improve Outcome: Progressing Goal: Sleeping patterns will improve Outcome: Progressing

## 2023-08-02 NOTE — Plan of Care (Signed)
Problem: Activity: Goal: Interest or engagement in activities will improve Outcome: Progressing   Problem: Coping: Goal: Ability to demonstrate self-control will improve Outcome: Progressing

## 2023-08-02 NOTE — Progress Notes (Signed)
   08/02/23 2100  Psych Admission Type (Psych Patients Only)  Admission Status Involuntary  Psychosocial Assessment  Patient Complaints Depression  Eye Contact Fair  Facial Expression Anxious  Affect Anxious  Speech Logical/coherent  Interaction Assertive  Motor Activity Fidgety  Appearance/Hygiene Unremarkable  Behavior Characteristics Cooperative;Anxious  Mood Depressed;Anxious  Thought Process  Coherency WDL  Content Blaming others  Delusions None reported or observed  Perception WDL  Hallucination None reported or observed  Judgment Impaired  Confusion WDL  Danger to Self  Current suicidal ideation? Denies  Danger to Others  Danger to Others None reported or observed

## 2023-08-02 NOTE — [facility] Group Notes (Signed)
Adult Psychoeducational Group Note  Date:  08/02/2023 Time:  9:11 PM  Group Topic/Focus:  Wrap-Up Group:   The focus of this group is to help patients review their daily goal of treatment and discuss progress on daily workbooks.  Participation Level:  Active  Participation Quality:  Appropriate  Affect:  Appropriate  Cognitive:  Appropriate  Insight: Appropriate  Engagement in Group:  Engaged  Modes of Intervention:  Discussion  Additional Comments:   Pt states that he had a good day and was able to go outside with his peers. Pt states that he had a visit with his boyfriend which he enjoyed. Pt denied everything and states that he's been working on setting boundaries   Vevelyn Pat 08/02/2023, 9:11 PM

## 2023-08-03 ENCOUNTER — Encounter (HOSPITAL_COMMUNITY): Payer: Self-pay

## 2023-08-03 MED ORDER — DOCUSATE SODIUM 100 MG PO CAPS
100.0000 mg | ORAL_CAPSULE | Freq: Two times a day (BID) | ORAL | Status: DC
  Administered 2023-08-03 – 2023-08-06 (×7): 100 mg via ORAL
  Filled 2023-08-03 (×11): qty 1

## 2023-08-03 MED ORDER — ARIPIPRAZOLE 15 MG PO TABS
7.5000 mg | ORAL_TABLET | Freq: Every day | ORAL | Status: DC
  Administered 2023-08-03 – 2023-08-04 (×2): 7.5 mg via ORAL
  Filled 2023-08-03 (×3): qty 1

## 2023-08-03 NOTE — [facility] Group Notes (Signed)
Adult Psychoeducational Group Note  Date:  08/03/2023 Time:  10:35 AM  Group Topic/Focus:  Goals Group:   The focus of this group is to help patients establish daily goals to achieve during treatment and discuss how the patient can incorporate goal setting into their daily lives to aide in recovery.  Participation Level:  Did Not Attend  Participation Quality:    Affect:    Cognitive:    Insight:   Engagement in Group:    Modes of Intervention:    Additional Comments:    Sheran Lawless 08/03/2023, 10:35 AM

## 2023-08-03 NOTE — Group Note (Signed)
Recreation Therapy Group Note   Group Topic:Healthy Decision Making  Group Date: 08/03/2023 Start Time: 1010 End Time: 1045 Facilitators: Lulla Linville-McCall, LRT,CTRS Location: [address] Dayroom   Group Topic: Decision Making, Problem Solving, Communication  Goal Area(s) Addresses:  Patient will effectively work with peer towards shared goal.  Patient will identify factors that guided their decision making.  Patient will pro-socially communicate ideas during group session.   Intervention: Survival Scenario - pencil, paper  Group Description: Patients were given a scenario that they were going to be stranded on a deserted [place] for several months before being rescued. Writer tasked them with making a list of 15 things they would choose to bring with them for "survival". The list of items was prioritized most important to least. Each patient would come up with their own list, then work together to create a new list of 15 items while in a group of 3-5 peers. LRT discussed each person's list and how it differed from others. The debrief included discussion of priorities, good decisions versus bad decisions, and how it is important to think before acting so we can make the best decision possible. LRT tied the concept of effective communication among group members to patient's support systems outside of the hospital and its benefit post discharge.  Education: Pharmacist, community, Priorities, Support System, Discharge Planning   Education Outcome: Acknowledges education/In group clarification/Needs additional education   Affect/Mood: N/A   Participation Level: Did not attend    Clinical Observations/Individualized Feedback:     Plan: Continue to engage patient in RT group sessions 2-3x/week.   Cola Highfill-McCall, LRT,CTRS  08/03/2023 1:16 PM

## 2023-08-03 NOTE — Group Note (Signed)
LCSW Group Therapy Note   Group Date: 08/03/2023 Start Time: 1300 End Time: 1400   Type of Therapy and Topic:  Group Therapy: Challenging Core Beliefs  Participation Level:  Did Not Attend  Description of Group:  Patients were educated about core beliefs and asked to identify one harmful core belief that they have. Patients were asked to explore from where those beliefs originate. Patients were asked to discuss how those beliefs make them feel and the resulting behaviors of those beliefs. They were then be asked if those beliefs are true and, if so, what evidence they have to support them. Lastly, group members were challenged to replace those negative core beliefs with helpful beliefs.   Therapeutic Goals:   1. Patient will identify harmful core beliefs and explore the origins of such beliefs. 2. Patient will identify feelings and behaviors that result from those core beliefs. 3. Patient will discuss whether such beliefs are true. 4.  Patient will replace harmful core beliefs with helpful ones.  Summary of Patient Progress:  Did Not Attend   Therapeutic Modalities: Cognitive Behavioral Therapy; Solution-Focused Therapy   Beather Arbour 08/03/2023  3:42 PM

## 2023-08-03 NOTE — Progress Notes (Signed)
   08/03/23 0559  15 Minute Checks  Location Bedroom  Visual Appearance Calm  Behavior Sleeping  Sleep (Behavioral Health Patients Only)  Calculate sleep? (Click Yes once per 24 hr at 0600 safety check) Yes  Documented sleep last 24 hours 9.75

## 2023-08-03 NOTE — BH IP Treatment Plan (Signed)
Interdisciplinary Treatment and Diagnostic Plan Update  08/03/2023 Time of Session: 11:40am MEREL KARAMAN MRN: [id number]  Principal Diagnosis: Depression  Secondary Diagnoses: Principal Problem:   Depression   Current Medications:  Current Facility-Administered Medications  Medication Dose Route Frequency Provider Last Rate Last Admin   acetaminophen (TYLENOL) tablet 650 mg  650 mg Oral Q6H PRN Jearld Lesch, NP       alum & mag hydroxide-simeth (MAALOX/MYLANTA) 200-200-20 MG/5ML suspension 30 mL  30 mL Oral Q4H PRN Dixon, Rashaun M, NP       ARIPiprazole (ABILIFY) tablet 7.5 mg  7.5 mg Oral QHS Pashayan, Mardelle Matte, MD       diphenhydrAMINE (BENADRYL) capsule 50 mg  50 mg Oral TID PRN Jearld Lesch, NP       Or   diphenhydrAMINE (BENADRYL) injection 50 mg  50 mg Intramuscular TID PRN Durwin Nora, Rashaun M, NP       docusate sodium (COLACE) capsule 100 mg  100 mg Oral BID Lauro Franklin, MD   100 mg at 08/03/23 1150   haloperidol (HALDOL) tablet 5 mg  5 mg Oral TID PRN Jearld Lesch, NP       Or   haloperidol lactate (HALDOL) injection 5 mg  5 mg Intramuscular TID PRN Jearld Lesch, NP       hydrOXYzine (ATARAX) tablet 25 mg  25 mg Oral TID PRN Jearld Lesch, NP   25 mg at 08/02/23 2055   LORazepam (ATIVAN) tablet 2 mg  2 mg Oral TID PRN Jearld Lesch, NP       Or   LORazepam (ATIVAN) injection 2 mg  2 mg Intramuscular TID PRN Jearld Lesch, NP       magnesium hydroxide (MILK OF MAGNESIA) suspension 30 mL  30 mL Oral Daily PRN Jearld Lesch, NP   30 mL at 08/03/23 1151   nicotine polacrilex (NICORETTE) gum 2 mg  2 mg Oral PRN Sarita Bottom, MD       traZODone (DESYREL) tablet 50 mg  50 mg Oral QHS Dixon, Rashaun M, NP       traZODone (DESYREL) tablet 50 mg  50 mg Oral QHS PRN Jearld Lesch, NP       PTA Medications: Medications Prior to Admission  Medication Sig Dispense Refill Last Dose   DESCOVY 200-25 MG tablet Take 1 tablet by mouth daily.  (Patient not taking: Reported on 07/31/2023)      divalproex (DEPAKOTE ER) 500 MG 24 hr tablet Take 1,500 mg by mouth daily.      lithium carbonate (LITHOBID) 300 MG ER tablet Take 900 mg by mouth at bedtime.      sertraline (ZOLOFT) 50 MG tablet Take 50 mg by mouth daily.       Patient Stressors: Copy difficulties   Marital or family conflict   Substance abuse    Patient Strengths: Manufacturing systems engineer  Physical Health  Special hobby/interest  Supportive family/friends   Treatment Modalities: Medication Management, Group therapy, Case management,  1 to 1 session with clinician, Psychoeducation, Recreational therapy.   Physician Treatment Plan for Primary Diagnosis: Depression Long Term Goal(s): Improvement in symptoms so as ready for discharge   Short Term Goals: Ability to verbalize feelings will improve Ability to identify and develop effective coping behaviors will improve Ability to maintain clinical measurements within normal limits will improve Compliance with prescribed medications will improve Ability to identify changes in lifestyle to reduce recurrence  of condition will improve Ability to disclose and discuss suicidal ideas Ability to demonstrate self-control will improve  Medication Management: Evaluate patient's response, side effects, and tolerance of medication regimen.  Therapeutic Interventions: 1 to 1 sessions, Unit Group sessions and Medication administration.  Evaluation of Outcomes: Progressing  Physician Treatment Plan for Secondary Diagnosis: Principal Problem:   Depression  Long Term Goal(s): Improvement in symptoms so as ready for discharge   Short Term Goals: Ability to verbalize feelings will improve Ability to identify and develop effective coping behaviors will improve Ability to maintain clinical measurements within normal limits will improve Compliance with prescribed medications will improve Ability to identify  changes in lifestyle to reduce recurrence of condition will improve Ability to disclose and discuss suicidal ideas Ability to demonstrate self-control will improve     Medication Management: Evaluate patient's response, side effects, and tolerance of medication regimen.  Therapeutic Interventions: 1 to 1 sessions, Unit Group sessions and Medication administration.  Evaluation of Outcomes: Progressing   RN Treatment Plan for Primary Diagnosis: Depression Long Term Goal(s): Knowledge of disease and therapeutic regimen to maintain health will improve  Short Term Goals: Ability to remain free from injury will improve, Ability to verbalize frustration and anger appropriately will improve, Ability to participate in decision making will improve, Ability to verbalize feelings will improve, Ability to identify and develop effective coping behaviors will improve, and Compliance with prescribed medications will improve  Medication Management: RN will administer medications as ordered by provider, will assess and evaluate patient's response and provide education to patient for prescribed medication. RN will report any adverse and/or side effects to prescribing provider.  Therapeutic Interventions: 1 on 1 counseling sessions, Psychoeducation, Medication administration, Evaluate responses to treatment, Monitor vital signs and CBGs as ordered, Perform/monitor CIWA, COWS, AIMS and Fall Risk screenings as ordered, Perform wound care treatments as ordered.  Evaluation of Outcomes: Progressing   LCSW Treatment Plan for Primary Diagnosis: Depression Long Term Goal(s): Safe transition to appropriate next level of care at discharge, Engage patient in therapeutic group addressing interpersonal concerns.  Short Term Goals: Engage patient in aftercare planning with referrals and resources, Increase social support, Increase emotional regulation, Facilitate acceptance of mental health diagnosis and concerns, Identify  triggers associated with mental health/substance abuse issues, and Increase skills for wellness and recovery  Therapeutic Interventions: Assess for all discharge needs, 1 to 1 time with Social worker, Explore available resources and support systems, Assess for adequacy in community support network, Educate family and significant other(s) on suicide prevention, Complete Psychosocial Assessment, Interpersonal group therapy.  Evaluation of Outcomes: Progressing   Progress in Treatment: Attending groups: No. Participating in groups: No. Taking medication as prescribed: Yes. Toleration medication: Yes. Family/Significant other contact made: No, will contact:  Houston Croney, [phone] Patient understands diagnosis: No. Discussing patient identified problems/goals with staff: Yes. Medical problems stabilized or resolved: No. Denies suicidal/homicidal ideation: Yes. Issues/concerns per patient self-inventory: No.  New problem(s) identified: No, Describe:  none reported  New Short Term/Long Term Goal(s):medication stabilization, elimination of SI thoughts, development of comprehensive mental wellness plan.    Patient Goals:  "To have space, to have a clear mind, to gain back the weight I lost and to have a bowel movement."  Discharge Plan or Barriers: Patient recently admitted. CSW will continue to follow and assess for appropriate referrals and possible discharge planning.    Reason for Continuation of Hospitalization: Anxiety Depression Medical Issues Medication stabilization Suicidal ideation  Estimated Length of Stay:5-7 days  Last 3 [place] Suicide Severity Risk Score: Flowsheet Row [facility] (Current) [facility] from [facility]31/2023 in [facility] from 07/23/2023 in [facility] Emergency Department at [facility]  C-SSRS RISK CATEGORY No Risk No Risk No Risk        Last PHQ 2/9 Scores:     No data to display          Scribe for Treatment Team: Izell Isabel, LCSW 08/03/2023 1:10 PM
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
## 2023-08-03 NOTE — Progress Notes (Signed)
[facility] MD Progress Note  08/03/2023 12:38 PM ARPAN ARNWINE  MRN:  [id number] Subjective:   Ronald Leach is a 22 yr old male who presented on 9/20 to [facility] ED under IVC for rapid mood swings and SI.  PPHx is significant for Bipolar Disorder, Meth Abuse, and Medication Non-Compliance, and 2 Prior Psychiatric Hospitalizations (last- [facility]).   Case was discussed in the multidisciplinary team. MAR was reviewed and patient was compliant with some of his medications, he refused his Trazodone.  He received PRN Hydroxyzine last night.   Psychiatric Team made the following recommendations yesterday: -Continue Abilify 5 mg daily for mood stability     On interview today patient reports he slept fair last night (per report slept 9.5 hours).  He reports his appetite is doing good.  He reports no SI, HI, or AVH.  He reports no Paranoia or Ideas of Reference.  He reports some issues with his medications, specifically he reports that his Abilify is making him very tired.  Discussed with him that we could shift his Abilify to the evening as he has not received it this morning yet and he was agreeable to this.  He reports that he has not had a BM in about a week.  Discussed with him that we would start medication for this and he was agreeable.  He reports no other concerns at present.    Principal Problem: Depression Diagnosis: Principal Problem:   Depression  Total Time spent with patient:  I personally spent 35 minutes on the unit in direct patient care. The direct patient care time included face-to-face time with the patient, reviewing the patient's chart, communicating with other professionals, and coordinating care. Greater than 50% of this time was spent in counseling or coordinating care with the patient regarding goals of hospitalization, psycho-education, and discharge planning needs.   Past Psychiatric History: Bipolar Disorder, Meth Abuse, and Medication Non-Compliance, and 2 Prior  Psychiatric Hospitalizations (last- [facility]).  Past Medical History: History reviewed. No pertinent past medical history. History reviewed. No pertinent surgical history. Family History: History reviewed. No pertinent family history. Family Psychiatric  History:  Aunt and Uncle- Bipolar Disorder Social History:  Social History   Substance and Sexual Activity  Alcohol Use None     Social History   Substance and Sexual Activity  Drug Use Never    Social History   Socioeconomic History   Marital status: Single    Spouse name: Not on file   Number of children: Not on file   Years of education: Not on file   Highest education level: Not on file  Occupational History   Not on file  Tobacco Use   Smoking status: Never   Smokeless tobacco: Never  Substance and Sexual Activity   Alcohol use: Not on file   Drug use: Never   Sexual activity: Not on file  Other Topics Concern   Not on file  Social History Narrative   Not on file   Social Determinants of Health   Financial Resource Strain: Not on file  Food Insecurity: Patient Declined (08/01/2023)   Hunger Vital Sign    Worried About Running Out of Food in the Last Year: Patient declined    Ran Out of Food in the Last Year: Patient declined  Transportation Needs: Patient Declined (08/01/2023)   PRAPARE - Administrator, Civil Service (Medical): Patient declined    Lack of Transportation (Non-Medical): Patient declined  Physical Activity: Not on file  Stress: Not on file  Social Connections: Not on file   Additional Social History:                         Sleep: Good  Appetite:  Good  Current Medications: Current Facility-Administered Medications  Medication Dose Route Frequency Provider Last Rate Last Admin   acetaminophen (TYLENOL) tablet 650 mg  650 mg Oral Q6H PRN Dixon, Rashaun M, NP       alum & mag hydroxide-simeth (MAALOX/MYLANTA) 200-200-20 MG/5ML suspension 30 mL  30 mL Oral Q4H PRN  Dixon, Rashaun M, NP       ARIPiprazole (ABILIFY) tablet 7.5 mg  7.5 mg Oral QHS Shenica Holzheimer, Mardelle Matte, MD       diphenhydrAMINE (BENADRYL) capsule 50 mg  50 mg Oral TID PRN Jearld Lesch, NP       Or   diphenhydrAMINE (BENADRYL) injection 50 mg  50 mg Intramuscular TID PRN Durwin Nora, Rashaun M, NP       docusate sodium (COLACE) capsule 100 mg  100 mg Oral BID Lauro Franklin, MD   100 mg at 08/03/23 1150   haloperidol (HALDOL) tablet 5 mg  5 mg Oral TID PRN Jearld Lesch, NP       Or   haloperidol lactate (HALDOL) injection 5 mg  5 mg Intramuscular TID PRN Jearld Lesch, NP       hydrOXYzine (ATARAX) tablet 25 mg  25 mg Oral TID PRN Jearld Lesch, NP   25 mg at 08/02/23 2055   LORazepam (ATIVAN) tablet 2 mg  2 mg Oral TID PRN Jearld Lesch, NP       Or   LORazepam (ATIVAN) injection 2 mg  2 mg Intramuscular TID PRN Jearld Lesch, NP       magnesium hydroxide (MILK OF MAGNESIA) suspension 30 mL  30 mL Oral Daily PRN Jearld Lesch, NP   30 mL at 08/03/23 1151   nicotine polacrilex (NICORETTE) gum 2 mg  2 mg Oral PRN Sarita Bottom, MD       traZODone (DESYREL) tablet 50 mg  50 mg Oral QHS Dixon, Rashaun M, NP       traZODone (DESYREL) tablet 50 mg  50 mg Oral QHS PRN Jearld Lesch, NP        Lab Results:  Results for orders placed or performed during the hospital encounter of 08/01/23 (from the past 48 hour(s))  Basic metabolic panel     Status: None   Collection Time: 08/01/23  6:32 PM  Result Value Ref Range   Sodium 138 135 - 145 mmol/L   Potassium 4.4 3.5 - 5.1 mmol/L   Chloride 100 98 - 111 mmol/L   CO2 29 22 - 32 mmol/L   Glucose, Bld 86 70 - 99 mg/dL    Comment: Glucose reference range applies only to samples taken after fasting for at least 8 hours.   BUN 13 6 - 20 mg/dL   Creatinine, Ser 1.61 0.61 - 1.24 mg/dL   Calcium 9.3 8.9 - 09.6 mg/dL   GFR, Estimated >04 >54 mL/min    Comment: (NOTE) Calculated using the CKD-EPI Creatinine Equation (2021)     Anion gap 9 5 - 15    Comment: Performed at [facility], [address], [place], [place] [address]    Blood Alcohol level:  Lab Results  Component Value Date   [facility] <10 07/31/2023    Metabolic  Disorder Labs: No results found for: "HGBA1C", "MPG" No results found for: "PROLACTIN" No results found for: "CHOL", "TRIG", "HDL", "CHOLHDL", "VLDL", "LDLCALC"  Physical Findings: AIMS:  , ,  ,  ,    CIWA:    COWS:     Musculoskeletal: Strength & Muscle Tone: within normal limits Gait & Station:  laying in bed Patient leans: N/A  Psychiatric Specialty Exam:  Presentation  General Appearance:  Casual  Eye Contact: Poor  Speech: Clear and Coherent; Slow  Speech Volume: Decreased  Handedness: Right   Mood and Affect  Mood: Dysphoric; Irritable  Affect: Constricted; Depressed   Thought Process  Thought Processes: Linear  Descriptions of Associations:Intact  Orientation:Full (Time, Place and Person)  Thought Content:WDL  History of Schizophrenia/Schizoaffective disorder:No  Duration of Psychotic Symptoms:No data recorded Hallucinations:Hallucinations: None  Ideas of Reference:None  Suicidal Thoughts:Suicidal Thoughts: No  Homicidal Thoughts:Homicidal Thoughts: No   Sensorium  Memory: Immediate Fair; Recent Fair  Judgment: Poor  Insight: Poor   Executive Functions  Concentration: Fair  Attention Span: Fair  Recall: Fair  Fund of Knowledge: Fair  Language: Fair   Psychomotor Activity  Psychomotor Activity: Psychomotor Activity: Normal   Assets  Assets: Desire for Improvement; Communication Skills; Resilience; Physical Health   Sleep  Sleep: Sleep: Good Number of Hours of Sleep: 9.5    Physical Exam: Physical Exam Vitals and nursing note reviewed.  Constitutional:      General: He is not in acute distress.    Appearance: Normal appearance. He is normal weight. He is not ill-appearing or  toxic-appearing.  HENT:     Head: Normocephalic and atraumatic.  Pulmonary:     Effort: Pulmonary effort is normal.  Musculoskeletal:        General: Normal range of motion.  Neurological:     General: No focal deficit present.     Mental Status: He is alert.    Review of Systems  Respiratory:  Negative for cough and shortness of breath.   Cardiovascular:  Negative for chest pain.  Gastrointestinal:  Positive for constipation. Negative for abdominal pain, diarrhea, nausea and vomiting.  Neurological:  Negative for dizziness, weakness and headaches.  Psychiatric/Behavioral:  Negative for depression, hallucinations and suicidal ideas. The patient is not nervous/anxious.    Blood pressure 122/83, pulse (!) 107, temperature (!) 97.1 F (36.2 C), temperature source Oral, resp. rate 16, height 5\' 7"  (1.702 m), weight 55.3 kg, SpO2 99%. Body mass index is 19.11 kg/m.   Treatment Plan Summary: Daily contact with patient to assess and evaluate symptoms and progress in treatment and Medication management  Ronald Leach is a 22 yr old male who presented on 9/20 to [facility] ED under IVC for rapid mood swings and SI.  PPHx is significant for Bipolar Disorder, Meth Abuse, and Medication Non-Compliance, and 2 Prior Psychiatric Hospitalizations (last- [facility]).   Whit has been having sedation from the Abilify so we will change it to this evening.  We will also increase his Abilify for better mood stability.  Since he has not had a BM in about a week we will start Colace.  We will not make any other changes to his medications at this time.  We will continue to monitor.   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

## 2023-08-03 NOTE — Plan of Care (Signed)
Problem: Safety: Goal: Periods of time without injury will increase Outcome: Progressing   Pt A & O to self, place, situation, day and month. Observed to be irritable with blunted affect, depressed mood. Denies SI, HI, AVH and pain when assessed. Required multiple prompts to come out of his room for scheduled medications "It's so much going on. Coming to my room is one thing but I was not told about medications" despite MHT and writer going to his room on three separate occasions. Reports he slept well with good appetite. Isolative, guarded to room most of this shift. Out of bed for meals only. Received PRN MOM 30 ml for no BM X 3 days with result when reassessed at 1250. Safety checks maintained at Q 15 minutes intervals without incident.

## 2023-08-03 NOTE — Progress Notes (Signed)
   08/03/23 2345  Psych Admission Type (Psych Patients Only)  Admission Status Involuntary  Psychosocial Assessment  Patient Complaints Anxiety;Sadness  Eye Contact Fair  Facial Expression Anxious;Worried  Affect Appropriate to circumstance  Surveyor, minerals Activity Slow  Appearance/Hygiene Unremarkable  Behavior Characteristics Cooperative  Mood Anxious  Aggressive Behavior  Effect No apparent injury  Thought Process  Coherency Circumstantial  Content Blaming others  Delusions None reported or observed  Perception WDL  Hallucination None reported or observed  Judgment Poor  Confusion None  Danger to Self  Current suicidal ideation? Denies  Danger to Others  Danger to Others None reported or observed

## 2023-08-04 DIAGNOSIS — F3110 Bipolar disorder, current episode manic without psychotic features, unspecified: Principal | ICD-10-CM

## 2023-08-04 LAB — LIPID PANEL
Cholesterol: 199 mg/dL (ref 0–200)
HDL: 52 mg/dL (ref >40)
LDL Cholesterol: 128 mg/dL — ABNORMAL HIGH (ref 0–99)
Total CHOL/HDL Ratio: 3.8 RATIO
Triglycerides: 93 mg/dL (ref <150)
VLDL: 19 mg/dL (ref 0–40)

## 2023-08-04 NOTE — Plan of Care (Signed)

## 2023-08-04 NOTE — Plan of Care (Signed)
  Problem: Education: Goal: Knowledge of [facility] Education information/materials will improve Outcome: Progressing Goal: Emotional status will improve Outcome: Progressing Goal: Mental status will improve Outcome: Progressing   Problem: Activity: Goal: Sleeping patterns will improve Outcome: Progressing   Problem: Safety: Goal: Periods of time without injury will increase Outcome: Progressing

## 2023-08-04 NOTE — Group Note (Signed)
Date:  08/04/2023 Time:  8:59 PM  Group Topic/Focus:  Wrap-Up Group:   The focus of this group is to help patients review their daily goal of treatment and discuss progress on daily workbooks.    Participation Level:  Active  Participation Quality:  Appropriate and Sharing  Affect:  Appropriate  Cognitive:  Appropriate  Insight: Appropriate  Engagement in Group:  Engaged  Modes of Intervention:  Activity and Socialization  Additional Comments:  The patient stated that he had a good day today. The patient shared that he was able to catch up on some much needed sleep. The patient stated that he did not attend many groups today. The patient stated that his goal was to "gain insight/support from different sources". The patient rated his day a 10/10. The patient did share that he has been having some trouble sleeping throughout the night. The patient did participate in the ice breaker activity at the end of group.   Ronald Leach 08/04/2023, 8:59 PM

## 2023-08-04 NOTE — Group Note (Signed)
Recreation Therapy Group Note   Group Topic:Health and Wellness  Group Date: 08/04/2023 Start Time: 1040 End Time: 1110 Facilitators: Philana Younis-McCall, LRT,CTRS Location: [address] Dayroom   Group Topic: Wellness  Goal Area(s) Addresses:  Patient will define components of whole wellness. Patient will verbalize benefit of whole wellness.  Group Description: Exercise. LRT explained to patients the importance of being physically active. LRT stressed that being active doesn't mean do anything strenuous but simply getting your body moving. Patients took turns leading the group in the exercises of their choosing. Patients were to try and exercise for at least 30 minutes. Patients were instructed to take breaks and get water as needed.   Education: Wellness, Building control surveyor.   Education Outcome: Acknowledges education/In group clarification offered/Needs additional education.    Affect/Mood: N/A   Participation Level: Did not attend    Clinical Observations/Individualized Feedback:    Plan: Continue to engage patient in RT group sessions 2-3x/week.   Lisa Milian-McCall, LRT,CTRS  08/04/2023 12:17 PM

## 2023-08-04 NOTE — Group Note (Signed)
Recreation Therapy Group Note   Group Topic:Animal Assisted Therapy   Group Date: 08/04/2023 Start Time: 0945 End Time: 1030 Facilitators: Haydee Jabbour-McCall, LRT,CTRS Location: [address] Dayroom   Animal-Assisted Activity (AAA) Program Checklist/Progress Notes Patient Eligibility Criteria Checklist & Daily Group note for Rec Tx Intervention  AAA/T Program Assumption of Risk Form signed by Patient/ or Parent Legal Guardian Yes  Patient is free of allergies or severe asthma Yes  Patient reports no fear of animals Yes  Patient reports no history of cruelty to animals Yes  Patient understands his/her participation is voluntary Yes  Patient washes hands before animal contact Yes  Patient washes hands after animal contact Yes   Affect/Mood: Flat   Participation Level: Minimal   Participation Quality: Independent   Behavior: Attentive    Speech/Thought Process: Logical   Insight: Fair   Judgement: Fair    Modes of Intervention: Teaching laboratory technician   Patient Response to Interventions:  Attentive   Education Outcome:  In group clarification offered    Clinical Observations/Individualized Feedback: Pt was quiet but had some interaction with the therapy dog. Pt was also attentive to the interaction of others as well.    Plan: Continue to engage patient in RT group sessions 2-3x/week.   Season Astacio-McCall, LRT,CTRS 08/04/2023 12:47 PM

## 2023-08-04 NOTE — Progress Notes (Signed)
Recreation Therapy Notes  INPATIENT RECREATION THERAPY ASSESSMENT  Patient Details Name: Ronald Leach MRN: [id number] DOB: 2001-06-28 Today's Date: 08/04/2023       Information Obtained From: Patient  Able to Participate in Assessment/Interview: Yes  Patient Presentation: Alert  Reason for Admission (Per Patient): Suicidal Ideation, Substance Abuse  Patient Stressors: Family  Coping Skills:   Isolation, Journal, Exercise, Music, Deep Breathing, Meditate, Substance Abuse, Talk, Art, Prayer, Read, Hot Bath/Shower  Leisure Interests (2+):  Exercise - Walking, Social - Friends, Individual - Other (Comment), Music - Write music (Substance Use)  Frequency of Recreation/Participation: Other (Comment) (Daily)  Awareness of Community Resources:  Yes  Community Resources:  Library, [place], [place], Public affairs consultant  Current Use: Yes  If no, Barriers?:    Expressed Interest in [employer] Information: No  [place]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

## 2023-08-04 NOTE — Progress Notes (Signed)
   08/04/23 2215  Psych Admission Type (Psych Patients Only)  Admission Status Involuntary  Psychosocial Assessment  Patient Complaints Anxiety;Sadness  Eye Contact Fair  Facial Expression Anxious;Worried  Affect Appropriate to circumstance  Surveyor, minerals Activity Slow  Appearance/Hygiene Unremarkable  Behavior Characteristics Cooperative  Mood Depressed;Anxious  Aggressive Behavior  Effect No apparent injury  Thought Process  Coherency Circumstantial  Content Blaming others  Delusions None reported or observed  Perception WDL  Hallucination None reported or observed  Judgment Poor  Confusion None  Danger to Self  Current suicidal ideation? Denies  Danger to Others  Danger to Others None reported or observed

## 2023-08-04 NOTE — Progress Notes (Signed)
[facility] MD Progress Note  08/04/2023 11:25 AM INFINITE NAYLOR  MRN:  [id number] Subjective:   BAYDEN JEANNOT is a 22 yr old male who presented on 9/20 to [facility] ED under IVC for rapid mood swings and SI.  PPHx is significant for Bipolar Disorder, Meth Abuse, and Medication Non-Compliance, and 2 Prior Psychiatric Hospitalizations (last- [facility] 05/2023), and no history of Suicide Attempts or Self Injurious Behavior.   Case was discussed in the multidisciplinary team. MAR was reviewed and patient was compliant with some of his medications, he refused his Trazodone.  He received PRN Hydroxyzine last night.   Psychiatric Team made the following recommendations yesterday: -Change Abilify to 7.5 mg QHS for mood stability      On interview today patient reports he slept good last night.  He reports his appetite is doing good.  He reports no SI, HI, or AVH.  He reports no Paranoia or Ideas of Reference.  He reports no issues with his medications.  He reports that he is less tired today with having taken the Abilify at night yesterday.  He reports that he was able to have a BM last night.  Discussed with him that we would continue with the Colace for another 2 days and he was agreeable with this.  He reports no other concerns at present.     Principal Problem: Bipolar I disorder, most recent episode (or current) manic (HCC) Diagnosis: Principal Problem:   Bipolar I disorder, most recent episode (or current) manic (HCC) Active Problems:   Methamphetamine dependence (HCC)  Total Time spent with patient:  I personally spent 35 minutes on the unit in direct patient care. The direct patient care time included face-to-face time with the patient, reviewing the patient's chart, communicating with other professionals, and coordinating care. Greater than 50% of this time was spent in counseling or coordinating care with the patient regarding goals of hospitalization, psycho-education, and discharge planning  needs.   Past Psychiatric History: Bipolar Disorder, Meth Abuse, and Medication Non-Compliance, and 2 Prior Psychiatric Hospitalizations (last- [facility] 05/2023), and no history of Suicide Attempts or Self Injurious Behavior.  Past Medical History: History reviewed. No pertinent past medical history. History reviewed. No pertinent surgical history. Family History: History reviewed. No pertinent family history. Family Psychiatric  History:  Mother- EtOH, Pain Pill Abuse, 2 Suicide Attempts Father- EtOH Abuse Maternal Aunt- Bipolar Disorder, Polysubstance Abuse, 6 Suicide Attempts Maternal Uncle- Bipolar Disorder Paternal Aunt- Anxiety  Social History:  Social History   Substance and Sexual Activity  Alcohol Use None     Social History   Substance and Sexual Activity  Drug Use Never    Social History   Socioeconomic History   Marital status: Single    Spouse name: Not on file   Number of children: Not on file   Years of education: Not on file   Highest education level: Not on file  Occupational History   Not on file  Tobacco Use   Smoking status: Never   Smokeless tobacco: Never  Substance and Sexual Activity   Alcohol use: Not on file   Drug use: Never   Sexual activity: Not on file  Other Topics Concern   Not on file  Social History Narrative   Not on file   Social Determinants of Health   Financial Resource Strain: Not on file  Food Insecurity: Patient Declined (08/01/2023)   Hunger Vital Sign    Worried About Running Out of Food in the Last Year:  Patient declined    Barista in the Last Year: Patient declined  Transportation Needs: Patient Declined (08/01/2023)   PRAPARE - Administrator, Civil Service (Medical): Patient declined    Lack of Transportation (Non-Medical): Patient declined  Physical Activity: Not on file  Stress: Not on file  Social Connections: Not on file   Additional Social History:                          Sleep: Good  Appetite:  Good  Current Medications: Current Facility-Administered Medications  Medication Dose Route Frequency Provider Last Rate Last Admin   acetaminophen (TYLENOL) tablet 650 mg  650 mg Oral Q6H PRN Jearld Lesch, NP       alum & mag hydroxide-simeth (MAALOX/MYLANTA) 200-200-20 MG/5ML suspension 30 mL  30 mL Oral Q4H PRN Dixon, Rashaun M, NP       ARIPiprazole (ABILIFY) tablet 7.5 mg  7.5 mg Oral QHS Lauro Franklin, MD   7.5 mg at 08/03/23 2053   diphenhydrAMINE (BENADRYL) capsule 50 mg  50 mg Oral TID PRN Jearld Lesch, NP       Or   diphenhydrAMINE (BENADRYL) injection 50 mg  50 mg Intramuscular TID PRN Durwin Nora, Rashaun M, NP       docusate sodium (COLACE) capsule 100 mg  100 mg Oral BID Lauro Franklin, MD   100 mg at 08/04/23 0945   haloperidol (HALDOL) tablet 5 mg  5 mg Oral TID PRN Jearld Lesch, NP       Or   haloperidol lactate (HALDOL) injection 5 mg  5 mg Intramuscular TID PRN Jearld Lesch, NP       hydrOXYzine (ATARAX) tablet 25 mg  25 mg Oral TID PRN Jearld Lesch, NP   25 mg at 08/03/23 2054   LORazepam (ATIVAN) tablet 2 mg  2 mg Oral TID PRN Jearld Lesch, NP       Or   LORazepam (ATIVAN) injection 2 mg  2 mg Intramuscular TID PRN Jearld Lesch, NP       magnesium hydroxide (MILK OF MAGNESIA) suspension 30 mL  30 mL Oral Daily PRN Jearld Lesch, NP   30 mL at 08/03/23 1151   nicotine polacrilex (NICORETTE) gum 2 mg  2 mg Oral PRN Sarita Bottom, MD       traZODone (DESYREL) tablet 50 mg  50 mg Oral QHS Dixon, Rashaun M, NP       traZODone (DESYREL) tablet 50 mg  50 mg Oral QHS PRN Jearld Lesch, NP        Lab Results:  Results for orders placed or performed during the hospital encounter of 08/01/23 (from the past 48 hour(s))  Lipid panel     Status: Abnormal   Collection Time: 08/04/23  6:28 AM  Result Value Ref Range   Cholesterol 199 0 - 200 mg/dL   Triglycerides 93 <295 mg/dL   HDL 52 >28 mg/dL   Total  CHOL/HDL Ratio 3.8 RATIO   VLDL 19 0 - 40 mg/dL   LDL Cholesterol 413 (H) 0 - 99 mg/dL    Comment:        Total Cholesterol/HDL:CHD Risk Coronary Heart Disease Risk Table                     Men   Women  1/2 Average Risk   3.4   3.3  Average Risk       5.0   4.4  2 X Average Risk   9.6   7.1  3 X Average Risk  23.4   11.0        Use the calculated Patient Ratio above and the CHD Risk Table to determine the patient's CHD Risk.        ATP III CLASSIFICATION (LDL):  <100     mg/dL   Optimal  161-096  mg/dL   Near or Above                    Optimal  130-159  mg/dL   Borderline  045-409  mg/dL   High  >811     mg/dL   Very High Performed at [facility], [address] [place], [place] [address]      Blood Alcohol level:  Lab Results  Component Value Date   ETH <10 07/31/2023    Metabolic Disorder Labs: No results found for: "HGBA1C", "MPG" No results found for: "PROLACTIN" Lab Results  Component Value Date   CHOL 199 08/04/2023   TRIG 93 08/04/2023   HDL 52 08/04/2023   CHOLHDL 3.8 08/04/2023   VLDL 19 08/04/2023   LDLCALC 128 (H) 08/04/2023    Physical Findings: AIMS:  , ,  ,  ,    CIWA:    COWS:     Musculoskeletal: Strength & Muscle Tone: within normal limits Gait & Station:  laying in bed Patient leans: N/A  Psychiatric Specialty Exam:  Presentation  General Appearance:  Casual  Eye Contact: Fair  Speech: Clear and Coherent; Normal Rate  Speech Volume: Normal  Handedness: Right   Mood and Affect  Mood: Dysphoric  Affect: Congruent   Thought Process  Thought Processes: Linear  Descriptions of Associations:Intact  Orientation:Full (Time, Place and Person)  Thought Content:WDL; Logical  History of Schizophrenia/Schizoaffective disorder:No  Duration of Psychotic Symptoms:No data recorded Hallucinations:Hallucinations: None  Ideas of Reference:None  Suicidal Thoughts:Suicidal Thoughts: No  Homicidal  Thoughts:Homicidal Thoughts: No   Sensorium  Memory: Immediate Fair; Recent Fair  Judgment: Intact  Insight: Present   Executive Functions  Concentration: Fair  Attention Span: Fair  Recall: Fair  Fund of Knowledge: Fair  Language: Good   Psychomotor Activity  Psychomotor Activity: Psychomotor Activity: Normal   Assets  Assets: Communication Skills; Desire for Improvement; Resilience; Physical Health   Sleep  Sleep: Sleep: Good Number of Hours of Sleep: 7.5    Physical Exam: Physical Exam Vitals and nursing note reviewed.  Constitutional:      General: He is not in acute distress.    Appearance: Normal appearance. He is normal weight. He is not ill-appearing or toxic-appearing.  HENT:     Head: Normocephalic and atraumatic.  Pulmonary:     Effort: Pulmonary effort is normal.  Musculoskeletal:        General: Normal range of motion.  Neurological:     General: No focal deficit present.     Mental Status: He is alert.    Review of Systems  Respiratory:  Negative for cough and shortness of breath.   Cardiovascular:  Negative for chest pain.  Gastrointestinal:  Negative for abdominal pain, constipation, diarrhea, nausea and vomiting.  Neurological:  Negative for dizziness, weakness and headaches.  Psychiatric/Behavioral:  Negative for depression, hallucinations and suicidal ideas. The patient is not nervous/anxious.    Blood pressure (!) 109/93, pulse 96, temperature 97.9 F (36.6 C), temperature source Oral,  resp. rate 18, height 5\' 7"  (1.702 m), weight 55.3 kg, SpO2 99%. Body mass index is 19.11 kg/m.   Treatment Plan Summary: Daily contact with patient to assess and evaluate symptoms and progress in treatment and Medication management  LOVETT BENDA is a 22 yr old male who presented on 9/20 to [facility] ED under IVC for rapid mood swings and SI.  PPHx is significant for Bipolar Disorder, Meth Abuse, and Medication Non-Compliance, and 2  Prior Psychiatric Hospitalizations (last- [facility] 05/2023), and no history of Suicide Attempts or Self Injurious Behavior.   Hadriel has had improvement with the change of Abilify to nighttime dosing.  We will plan to further increase the Abilify tomorrow to achieve a mood stability dose.  We will not make any changes to his medication at this time.  We will continue to monitor.   Bipolar Disorder, Manic Vs Substance Induced Mood Disorder: -Continue Abilify 7.5 mg QHS for mood stability -Continue Agitation Protocol: Haldol/Ativan/Benadryl   Nicotine Dependence: -Continue Nicotine Gum 2 mg PRN   -Continue Trazodone 50 mg QHS for sleep -Continue PRN's: Tylenol, Maalox, Atarax, Milk of Magnesia, Trazodone   Lauro Franklin, MD 08/04/2023, 11:25 AM

## 2023-08-04 NOTE — Plan of Care (Signed)
  Problem: Education: Goal: Mental status will improve Outcome: Progressing Goal: Verbalization of understanding the information provided will improve Outcome: Progressing   Problem: Activity: Goal: Sleeping patterns will improve Outcome: Progressing
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
## 2023-08-04 NOTE — [facility] Group Notes (Signed)
Adult Psychoeducational Group Note  Date:  08/04/2023 Time:  4:30 PM  Group Topic/Focus:  Goals Group:   The focus of this group is to help patients establish daily goals to achieve during treatment and discuss how the patient can incorporate goal setting into their daily lives to aide in recovery. Orientation:   The focus of this group is to educate the patient on the purpose and policies of crisis stabilization and provide a format to answer questions about their admission.  The group details unit policies and expectations of patients while admitted.  Participation Level:  Did Not Attend  Participation Quality:    Affect:    Cognitive:    Insight:   Engagement in Group:    Modes of Intervention:    Additional Comments:    Sheran Lawless 08/04/2023, 4:30 PM

## 2023-08-05 MED ORDER — ARIPIPRAZOLE 10 MG PO TABS
10.0000 mg | ORAL_TABLET | Freq: Every day | ORAL | Status: DC
  Filled 2023-08-05: qty 1

## 2023-08-05 MED ORDER — ARIPIPRAZOLE 10 MG PO TABS
10.0000 mg | ORAL_TABLET | Freq: Every day | ORAL | Status: DC
  Administered 2023-08-05: 10 mg via ORAL
  Filled 2023-08-05 (×2): qty 1

## 2023-08-05 NOTE — Progress Notes (Signed)
Adult Psychoeducational Group Note  Date:  08/05/2023 Time:  9:46 PM  Group Topic/Focus:  Wrap-Up Group:   The focus of this group is to help patients review their daily goal of treatment and discuss progress on daily workbooks.  Participation Level:  Active  Participation Quality:  Appropriate  Affect:  Appropriate  Cognitive:  Appropriate  Insight: Appropriate  Engagement in Group:  Engaged  Modes of Intervention:  Activity  Additional Comments:  Pt attended and participated in NA group.  Ronald Leach [name] 08/05/2023, 9:46 PM

## 2023-08-05 NOTE — Progress Notes (Signed)
   08/05/23 1430  Spiritual Encounters  Type of Visit Initial  Care provided to: Patient  Referral source Nurse (RN/NT/LPN)  Reason for visit Routine spiritual support  OnCall Visit No  Spiritual Framework  Presenting Themes Values and beliefs;Meaning/purpose/sources of inspiration;Goals in life/care;Significant life change;Coping tools;Impactful experiences and emotions;Courage hope and growth;Community and relationships  Values/beliefs positivity and community are important to the patient; belief in a higher power  Community/Connection Faith community;Other (comment) (NA community)  Strengths patien has an understanding of his disorder, states he is medication compliant, patient has hope for the future  Patient Stress Factors Family relationships  Family Stress Factors Not reviewed  Interventions  Spiritual Care Interventions Made Established relationship of care and support;Compassionate presence;Reflective listening;Normalization of emotions;Narrative/life review;Explored values/beliefs/practices/strengths;Encouragement  Intervention Outcomes  Outcomes Connection to spiritual care;Awareness of support;Awareness around self/spiritual resourses;Connection to values and goals of care;Connected to spiritual community;Awareness of health;Reduced anxiety  Spiritual Care Plan  Spiritual Care Issues Still Outstanding No further spiritual care needs at this time (see row info)   Patient has a positive outlook. He has found connection with others here and finds hope in observing how others have faced their own challenges. Chaplain helped patient to explore how he can find hope within himself. Patient and chaplain discussed his practices of self care and connection with a higher power. Patient looks forward to continuing to have a sense of community with his NA group as well as beginning to attend church in the episcopal tradition. Patient shared with chaplain about his struggles with body image, his  sexuality and drugs. Patient appears to be owning these issues and attempting to deal with them. He finds help and hope in the feeling of community with others who have experienced similar struggles.   Arlyce Dice, Chaplain Resident

## 2023-08-05 NOTE — [facility] Group Notes (Signed)
Spirituality group facilitated by Kathleen Argue, B[facility].  Group Description: Group focused on topic of hope. Patients participated in facilitated discussion around topic, connecting with one another around experiences and definitions for hope. Group members engaged with visual explorer photos, reflecting on what hope looks like for them today. Group engaged in discussion around how their definitions of hope are present today in hospital.  Modalities: Psycho-social ed, Adlerian, Narrative, MI  Patient Progress: Ronald Leach attended group and actively engaged and participated in group conversation and activities.  His comments demonstrated good insight and contributed positively to the group conversaiton.

## 2023-08-05 NOTE — Progress Notes (Signed)
[facility]/BM[facility] LCSW Progress Note   08/05/2023    2:16 PM  Clement Husbands      Type of Note: Follow up / DC Collateral    CSW spoke with patient today to get verbal consent to call his dad for safety planning since that is were he will return back to. CSW did ask patient how he was feeling and patient said that he felt "great", his medication is working and the different groups he has attend, have been "awesome". Patient stated, " it feels good, expressing how I feel and listening to others and their own dialogues". CSW will continue to assist.     Signed:   Jacob Moores, MSW, [facility] 08/05/2023 2:16 PM

## 2023-08-05 NOTE — Group Note (Signed)
Recreation Therapy Group Note   Group Topic:Self-Esteem  Group Date: 08/05/2023 Start Time: 1014 End Time: 1055 Facilitators: Jonah Nestle-McCall, LRT,CTRS Location: [address] Dayroom   Group Topic: Self Esteem    Goal Area(s) Addresses:  Patient will appropriately identify what self esteem is.  Patient will create a shield of armor describing themselves.  Patient will successfully identify positive attributes about themselves.  Patient will acknowledge benefit of improved self-esteem.  Patient will follow instructions on 1st prompt.    Intervention / Activity: Self-Esteem Shield. Patient attended a recreation therapy group session focused on self esteem. Patient identified what self esteem is, and why it is important to have high self esteem during group discussion. LRT gave patiens a sheet with a breakdown of what each quadrant was to represent. Patient was asked to create their own shield to show off their unique attributes, four quadrants reflected the following:  The Upper Left quadrant- reasons they are unique/special. The Upper Right quadrant- things that they love to do. The Lower Left quadrant- goals for their future. The Lower Right quadrant- character words that describe them.    Patients were provided sheets with the shield printed on them and colored pencils, markers and crayons to complete the activity.  Patients and writer had group related discussions while individually working on their activity.  Patients were debriefed on the importance of healthy self esteem and offered a handout for ways to increase self esteem.    Education: Self esteem, Communication, Positive self-talk, Discharge Planning   Education Outcome: Acknowledges education/Verbalizes understanding of Education/In group clarification offered/Needs further education   Affect/Mood: N/A   Participation Level: Did not attend    Clinical Observations/Individualized Feedback:     Plan: Continue  to engage patient in RT group sessions 2-3x/week.   Amiee Wiley-McCall, LRT,CTRS  08/05/2023 11:58 AM

## 2023-08-05 NOTE — Progress Notes (Signed)
[facility] MD Progress Note  08/05/2023 10:38 AM Ronald Leach  MRN:  [id number] Subjective:   Ronald Leach is a 22 yr old male who presented on 9/20 to [facility] ED under IVC for rapid mood swings and SI.  PPHx is significant for Bipolar Disorder, Meth Abuse, and Medication Non-Compliance, and 2 Prior Psychiatric Hospitalizations (last- [facility] 05/2023), and no history of Suicide Attempts or Self Injurious Behavior.   Case was discussed in the multidisciplinary team. MAR was reviewed and patient was compliant with some of his medications, he refused his Trazodone.  He received PRN Hydroxyzine last night.   Psychiatric Team made the following recommendations yesterday: -Continue Abilify 7.5 mg QHS for mood stability     On interview today patient reports he slept poor last night.  He reports his appetite is doing good.  He reports no SI, HI, or AVH.  He reports no Paranoia or Ideas of Reference.  He reports one issue with his medications.  He reports that the Abilify is now keeping him up at night.  Discussed with him that we could move his Abilify up in the day to noon today and the morning tomorrow and he was agreeable with this.  He reports that he is still having constipation as he has not had a BM in 2 days.  Discussed we would continue with Colace for now and encouraged him to drink more fluid and be out of bed and moving as much as possible.  He reports no other concerns present.     Principal Problem: Bipolar I disorder, most recent episode (or current) manic (HCC) Diagnosis: Principal Problem:   Bipolar I disorder, most recent episode (or current) manic (HCC) Active Problems:   Methamphetamine dependence (HCC)  Total Time spent with patient:  I personally spent 35 minutes on the unit in direct patient care. The direct patient care time included face-to-face time with the patient, reviewing the patient's chart, communicating with other professionals, and coordinating care. Greater than  50% of this time was spent in counseling or coordinating care with the patient regarding goals of hospitalization, psycho-education, and discharge planning needs.   Past Psychiatric History: Bipolar Disorder, Meth Abuse, and Medication Non-Compliance, and 2 Prior Psychiatric Hospitalizations (last- [facility] 05/2023), and no history of Suicide Attempts or Self Injurious Behavior.  Past Medical History: History reviewed. No pertinent past medical history. History reviewed. No pertinent surgical history. Family History: History reviewed. No pertinent family history. Family Psychiatric  History:  Mother- EtOH, Pain Pill Abuse, 2 Suicide Attempts Father- EtOH Abuse Maternal Aunt- Bipolar Disorder, Polysubstance Abuse, 6 Suicide Attempts Maternal Uncle- Bipolar Disorder Paternal Aunt- Anxiety  Social History:  Social History   Substance and Sexual Activity  Alcohol Use None     Social History   Substance and Sexual Activity  Drug Use Never    Social History   Socioeconomic History   Marital status: Single    Spouse name: Not on file   Number of children: Not on file   Years of education: Not on file   Highest education level: Not on file  Occupational History   Not on file  Tobacco Use   Smoking status: Never   Smokeless tobacco: Never  Substance and Sexual Activity   Alcohol use: Not on file   Drug use: Never   Sexual activity: Not on file  Other Topics Concern   Not on file  Social History Narrative   Not on file   Social Determinants of Health  Financial Resource Strain: Not on file  Food Insecurity: Patient Declined (08/01/2023)   Hunger Vital Sign    Worried About Running Out of Food in the Last Year: Patient declined    Ran Out of Food in the Last Year: Patient declined  Transportation Needs: Patient Declined (08/01/2023)   PRAPARE - Administrator, Civil Service (Medical): Patient declined    Lack of Transportation (Non-Medical): Patient declined   Physical Activity: Not on file  Stress: Not on file  Social Connections: Not on file   Additional Social History:                         Sleep: Good  Appetite:  Good  Current Medications: Current Facility-Administered Medications  Medication Dose Route Frequency Provider Last Rate Last Admin   acetaminophen (TYLENOL) tablet 650 mg  650 mg Oral Q6H PRN Dixon, Rashaun M, NP       alum & mag hydroxide-simeth (MAALOX/MYLANTA) 200-200-20 MG/5ML suspension 30 mL  30 mL Oral Q4H PRN Dixon, Rashaun M, NP       ARIPiprazole (ABILIFY) tablet 10 mg  10 mg Oral QHS Alizeh Madril, Mardelle Matte, MD       diphenhydrAMINE (BENADRYL) capsule 50 mg  50 mg Oral TID PRN Jearld Lesch, NP       Or   diphenhydrAMINE (BENADRYL) injection 50 mg  50 mg Intramuscular TID PRN Durwin Nora, Rashaun M, NP       docusate sodium (COLACE) capsule 100 mg  100 mg Oral BID Lauro Franklin, MD   100 mg at 08/05/23 0825   haloperidol (HALDOL) tablet 5 mg  5 mg Oral TID PRN Jearld Lesch, NP       Or   haloperidol lactate (HALDOL) injection 5 mg  5 mg Intramuscular TID PRN Jearld Lesch, NP       hydrOXYzine (ATARAX) tablet 25 mg  25 mg Oral TID PRN Jearld Lesch, NP   25 mg at 08/04/23 2043   LORazepam (ATIVAN) tablet 2 mg  2 mg Oral TID PRN Jearld Lesch, NP       Or   LORazepam (ATIVAN) injection 2 mg  2 mg Intramuscular TID PRN Jearld Lesch, NP       magnesium hydroxide (MILK OF MAGNESIA) suspension 30 mL  30 mL Oral Daily PRN Jearld Lesch, NP   30 mL at 08/05/23 1610   nicotine polacrilex (NICORETTE) gum 2 mg  2 mg Oral PRN Sarita Bottom, MD       traZODone (DESYREL) tablet 50 mg  50 mg Oral QHS Dixon, Rashaun M, NP       traZODone (DESYREL) tablet 50 mg  50 mg Oral QHS PRN Jearld Lesch, NP        Lab Results:  Results for orders placed or performed during the hospital encounter of 08/01/23 (from the past 48 hour(s))  Lipid panel     Status: Abnormal   Collection Time: 08/04/23   6:28 AM  Result Value Ref Range   Cholesterol 199 0 - 200 mg/dL   Triglycerides 93 <960 mg/dL   HDL 52 >45 mg/dL   Total CHOL/HDL Ratio 3.8 RATIO   VLDL 19 0 - 40 mg/dL   LDL Cholesterol 409 (H) 0 - 99 mg/dL    Comment:        Total Cholesterol/HDL:CHD Risk Coronary Heart Disease Risk Table  Men   Women  1/2 Average Risk   3.4   3.3  Average Risk       5.0   4.4  2 X Average Risk   9.6   7.1  3 X Average Risk  23.4   11.0        Use the calculated Patient Ratio above and the CHD Risk Table to determine the patient's CHD Risk.        ATP III CLASSIFICATION (LDL):  <100     mg/dL   Optimal  295-621  mg/dL   Near or Above                    Optimal  130-159  mg/dL   Borderline  308-657  mg/dL   High  >846     mg/dL   Very High Performed at [facility], [address], [place], [place] [address]      Blood Alcohol level:  Lab Results  Component Value Date   ETH <10 07/31/2023    Metabolic Disorder Labs: No results found for: "HGBA1C", "MPG" No results found for: "PROLACTIN" Lab Results  Component Value Date   CHOL 199 08/04/2023   TRIG 93 08/04/2023   HDL 52 08/04/2023   CHOLHDL 3.8 08/04/2023   VLDL 19 08/04/2023   LDLCALC 128 (H) 08/04/2023    Physical Findings: AIMS:  , ,  ,  ,    CIWA:    COWS:     Musculoskeletal: Strength & Muscle Tone: within normal limits Gait & Station:  laying in bed Patient leans: N/A  Psychiatric Specialty Exam:  Presentation  General Appearance:  Appropriate for Environment; Casual  Eye Contact: Fair  Speech: Clear and Coherent; Normal Rate  Speech Volume: Normal  Handedness: Right   Mood and Affect  Mood: Dysphoric  Affect: Congruent   Thought Process  Thought Processes: Coherent  Descriptions of Associations:Intact  Orientation:Full (Time, Place and Person)  Thought Content:Logical; WDL  History of Schizophrenia/Schizoaffective disorder:No  Duration  of Psychotic Symptoms:No data recorded Hallucinations:Hallucinations: None  Ideas of Reference:None  Suicidal Thoughts:Suicidal Thoughts: No  Homicidal Thoughts:Homicidal Thoughts: No   Sensorium  Memory: Immediate Fair; Recent Fair  Judgment: Fair  Insight: Fair   Art therapist  Concentration: Good  Attention Span: Good  Recall: Good  Fund of Knowledge: Good  Language: Good   Psychomotor Activity  Psychomotor Activity: Psychomotor Activity: Normal   Assets  Assets: Communication Skills; Desire for Improvement; Resilience; Physical Health   Sleep  Sleep: Sleep: Good Number of Hours of Sleep: 7.5    Physical Exam: Physical Exam Vitals and nursing note reviewed.  Constitutional:      General: He is not in acute distress.    Appearance: Normal appearance. He is normal weight. He is not ill-appearing or toxic-appearing.  HENT:     Head: Normocephalic and atraumatic.  Pulmonary:     Effort: Pulmonary effort is normal.  Musculoskeletal:        General: Normal range of motion.  Neurological:     General: No focal deficit present.     Mental Status: He is alert.    Review of Systems  Respiratory:  Negative for cough and shortness of breath.   Cardiovascular:  Negative for chest pain.  Gastrointestinal:  Positive for constipation. Negative for abdominal pain, diarrhea, nausea and vomiting.  Neurological:  Negative for dizziness, weakness and headaches.  Psychiatric/Behavioral:  Negative for depression, hallucinations and suicidal ideas. The patient  is not nervous/anxious.    Blood pressure 118/84, pulse 83, temperature 98 F (36.7 C), temperature source Oral, resp. rate 16, height 5\' 7"  (1.702 m), weight 55.3 kg, SpO2 100%. Body mass index is 19.11 kg/m.   Treatment Plan Summary: Daily contact with patient to assess and evaluate symptoms and progress in treatment and Medication management  Ronald Leach is a 22 yr old male who  presented on 9/20 to [facility] ED under IVC for rapid mood swings and SI.  PPHx is significant for Bipolar Disorder, Meth Abuse, and Medication Non-Compliance, and 2 Prior Psychiatric Hospitalizations (last- [facility] 05/2023), and no history of Suicide Attempts or Self Injurious Behavior.   Ronald Leach is showing improved insight as he is reporting his meth use has been affecting his health so significantly.  He reports that the Abilify is now keeping him up at night so we will begin moving it up during the day to noon today with the plan to change it to the morning tomorrow.   Bipolar Disorder, Manic Vs Substance Induced Mood Disorder: -Increase Abilify to 10 mg at noon for mood stability -Continue Agitation Protocol: Haldol/Ativan/Benadryl   Nicotine Dependence: -Continue Nicotine Gum 2 mg PRN   Constipation: -Continue Colace 100 mg BID    -Continue Trazodone 50 mg QHS for sleep -Continue PRN's: Tylenol, Maalox, Atarax, Milk of Magnesia, Trazodone   Lauro Franklin, MD 08/05/2023, 10:38 AM

## 2023-08-05 NOTE — Plan of Care (Signed)
Problem: Coping: Goal: Ability to demonstrate self-control will improve Outcome: Progressing   Problem: Health Behavior/Discharge Planning: Goal: Compliance with treatment plan for underlying cause of condition will improve Outcome: Progressing   Problem: Physical Regulation: Goal: Ability to maintain clinical measurements within normal limits will improve Outcome: Progressing  Pt A & O X 4. Denies SI, HI, AVH and pain when assessed. Reports he slept well with good appetite. Affect remains flat, pt is guarded, isolative to room earlier this shift.  Visible in hall in front of nursing station this evening, minimal when engaged. Tolerates meals, fluids and medications well without discomfort. Safety checks maintained at Q 15 minutes intervals without incident.

## 2023-08-05 NOTE — [facility] Group Notes (Signed)
Adult Psychoeducational Group Note  Date:  08/05/2023 Time:  9:47 AM  Group Topic/Focus:  Goals Group:   The focus of this group is to help patients establish daily goals to achieve during treatment and discuss how the patient can incorporate goal setting into their daily lives to aide in recovery. Orientation:   The focus of this group is to educate the patient on the purpose and policies of crisis stabilization and provide a format to answer questions about their admission.  The group details unit policies and expectations of patients while admitted.  Participation Level:  Did Not Attend  Participation Quality:    Affect:    Cognitive:    Insight:   Engagement in Group:    Modes of Intervention:    Additional Comments:    Sheran Lawless 08/05/2023, 9:47 AM
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
## 2023-08-06 LAB — HEMOGLOBIN A1C
Hgb A1c MFr Bld: <4.2 % — ABNORMAL LOW (ref 4.8–5.6)
Mean Plasma Glucose: <74 mg/dL

## 2023-08-06 MED ORDER — NICOTINE POLACRILEX 2 MG MT GUM: 2.0000 mg | CHEWING_GUM | OROMUCOSAL | 0 refills | Status: DC | PRN

## 2023-08-06 MED ORDER — ARIPIPRAZOLE 10 MG PO TABS: 10.0000 mg | ORAL_TABLET | Freq: Every day | ORAL | 0 refills | Status: DC

## 2023-08-06 MED ORDER — HYDROXYZINE HCL 25 MG PO TABS: 25.0000 mg | ORAL_TABLET | Freq: Three times a day (TID) | ORAL | 0 refills | Status: DC | PRN

## 2023-08-06 MED ORDER — DOCUSATE SODIUM 100 MG PO CAPS: 100.0000 mg | ORAL_CAPSULE | Freq: Two times a day (BID) | ORAL | Status: DC

## 2023-08-06 MED ORDER — ARIPIPRAZOLE 10 MG PO TABS
10.0000 mg | ORAL_TABLET | Freq: Every day | ORAL | Status: DC
  Administered 2023-08-06: 10 mg via ORAL
  Filled 2023-08-06 (×3): qty 1

## 2023-08-06 NOTE — Progress Notes (Addendum)
  [facility] Adult Case Management Discharge Plan :  Will you be returning to the same living situation after discharge:  Yes,  Pt will be returning back to his dad house  At discharge, do you have transportation home?: Yes,  Pt boyfriend will be coming to pick patient up, CSW spoke with BF around 10:35AM  Do you have the ability to pay for your medications: Yes,  Pt has [employer]   Release of information consent forms completed and in the chart;  Patient's signature needed at discharge.  Patient to Follow up at:  Follow-up Information     [facility] Follow up on 08/14/2023.   Why: You have a hospital follow up appointment for medication management and/or therapy services on 08/14/23 at 8:00 am.  This will be a Virtual telehealth appt. Contact information: [address]  [address] [place] [address] [phone]                 Next level of care provider has access to [facility] Link:no  Safety Planning and Suicide Prevention discussed: Yes,  Severo Brian, [phone] and father Kadar Merkerson [phone]     Has patient been referred to the Quitline?: Patient does not use tobacco/nicotine products  Patient has been referred for addiction treatment: Patient refused referral for treatment. Pt was positive for amphetamines and Cannabinoid    Isabella Bowens, LCSWA 08/06/2023, 9:28 AM

## 2023-08-06 NOTE — [facility] Suicide Risk Assessment (Addendum)
Suicide Risk Assessment  Discharge Assessment    [facility] Discharge Suicide Risk Assessment   Principal Problem: Bipolar I disorder, most recent episode (or current) manic (HCC) Discharge Diagnoses: Principal Problem:   Bipolar I disorder, most recent episode (or current) manic (HCC) Active Problems:   Methamphetamine dependence (HCC)  During the patient's hospitalization, patient had extensive initial psychiatric evaluation, and follow-up psychiatric evaluations every day.  Psychiatric diagnoses provided upon initial assessment: Bipolar Disorder, Current Episode Manic,  Meth Abuse  Patient's psychiatric medications were adjusted on admission: Started on Abilify.  During the hospitalization, other adjustments were made to the patient's psychiatric medication regimen: His Abilify was titrated during his hospitalization.  Gradually, patient started adjusting to milieu.   Patient's care was discussed during the interdisciplinary team meeting every day during the hospitalization.  The patient is not having significant side effects to prescribed psychiatric medication.  Possible mild constipation from the Hydroxyzine.  The patient reports their target psychiatric symptoms of depression and SI responded well to the psychiatric medications, and the patient reports overall benefit other psychiatric hospitalization. Supportive psychotherapy was provided to the patient. The patient also participated in regular group therapy while admitted.   Labs were reviewed with the patient, and abnormal results were discussed with the patient.  The patient denied having suicidal thoughts more than 48 hours prior to discharge.  Patient denies having homicidal thoughts.  Patient denies having auditory hallucinations.  Patient denies any visual hallucinations.  Patient denies having paranoid thoughts.  The patient is able to verbalize their individual safety plan to this provider.  It is recommended to the patient  to continue psychiatric medications as prescribed, after discharge from the hospital.    It is recommended to the patient to follow up with your outpatient psychiatric provider and PCP.  Discussed with the patient, the impact of alcohol, drugs, tobacco have been there overall psychiatric and medical wellbeing, and total abstinence from substance use was recommended the patient.  Total Time spent with patient: 20 minutes  Musculoskeletal: Strength & Muscle Tone: within normal limits Gait & Station: normal Patient leans: N/A  Psychiatric Specialty Exam  Presentation  General Appearance:  Appropriate for Environment; Casual  Eye Contact: Good  Speech: Clear and Coherent; Normal Rate  Speech Volume: Normal  Handedness: Right   Mood and Affect  Mood: Euthymic  Duration of Depression Symptoms: Greater than two weeks  Affect: Appropriate; Congruent   Thought Process  Thought Processes: Coherent; Goal Directed  Descriptions of Associations:Intact  Orientation:Full (Time, Place and Person)  Thought Content:Logical; WDL  History of Schizophrenia/Schizoaffective disorder:No  Duration of Psychotic Symptoms:No data recorded Hallucinations:Hallucinations: None  Ideas of Reference:None  Suicidal Thoughts:Suicidal Thoughts: No  Homicidal Thoughts:Homicidal Thoughts: No   Sensorium  Memory: Immediate Fair; Recent Fair  Judgment: Good  Insight: Good   Executive Functions  Concentration: Good  Attention Span: Good  Recall: Good  Fund of Knowledge: Good  Language: Good   Psychomotor Activity  Psychomotor Activity: Psychomotor Activity: Normal   Assets  Assets: Communication Skills; Desire for Improvement; Resilience; Physical Health   Sleep  Sleep: Sleep: Good Number of Hours of Sleep: 9.75   Physical Exam: Physical Exam Vitals and nursing note reviewed.  Constitutional:      General: He is not in acute distress.     Appearance: Normal appearance. He is normal weight. He is not ill-appearing or toxic-appearing.  HENT:     Head: Normocephalic and atraumatic.  Pulmonary:     Effort: Pulmonary effort  is normal.  Musculoskeletal:        General: Normal range of motion.  Neurological:     General: No focal deficit present.     Mental Status: He is alert.    Review of Systems  Respiratory:  Negative for cough and shortness of breath.   Cardiovascular:  Negative for chest pain.  Gastrointestinal:  Positive for constipation (mild). Negative for abdominal pain, diarrhea, nausea and vomiting.  Neurological:  Negative for dizziness, weakness and headaches.  Psychiatric/Behavioral:  Negative for depression, hallucinations and suicidal ideas. The patient is not nervous/anxious.    Blood pressure 114/81, pulse 90, temperature 98.2 F (36.8 C), temperature source Oral, resp. rate 16, height 5\' 7"  (1.702 m), weight 55.3 kg, SpO2 100%. Body mass index is 19.11 kg/m.  Mental Status Per Nursing Assessment::   On Admission:  Plan to harm others  Demographic Factors:  Male, Adolescent or young adult, Caucasian, and Gay, lesbian, or bisexual orientation  Loss Factors: Financial problems/change in socioeconomic status  Historical Factors: Family history of mental illness or substance abuse and Impulsivity  Risk Reduction Factors:   Living with another person, especially a relative, Positive social support, and Positive therapeutic relationship  Continued Clinical Symptoms:  More than one psychiatric diagnosis  Cognitive Features That Contribute To Risk:  None    Suicide Risk:  Minimal: No identifiable suicidal ideation.  Patients presenting with no risk factors but with morbid ruminations; may be classified as minimal risk based on the severity of the depressive symptoms   Follow-up Information     [facility] Follow up on 08/14/2023.   Why: You have a hospital follow up appointment for medication  management and/or therapy services on 08/14/23 at 8:00 am.  This will be a Virtual telehealth appt. Contact information: [address]  [address] [place] [address] [phone]                 Plan Of Care/Follow-up recommendations:  Activity: as tolerated  Diet: heart healthy  Other: -Follow-up with your outpatient psychiatric provider -instructions on appointment date, time, and address (location) are provided to you in discharge paperwork.  -Take your psychiatric medications as prescribed at discharge - instructions are provided to you in the discharge paperwork  -Follow-up with outpatient primary care doctor and other specialists -for management of chronic medical disease, including: Elevated Lipid Panel.  Substance Abuse treatment.  -Testing: Follow-up with outpatient provider for abnormal lab results: Elevated Lipid Panel.  -Recommend abstinence from alcohol, tobacco, and other illicit drug use at discharge.   -If your psychiatric symptoms recur, worsen, or if you have side effects to your psychiatric medications, call your outpatient psychiatric provider, 911, [phone] or go to the nearest emergency department.  -If suicidal thoughts recur, call your outpatient psychiatric provider, 911, [phone] or go to the nearest emergency department.   Lauro Franklin, MD 08/06/2023, 9:09 AM

## 2023-08-06 NOTE — Progress Notes (Signed)
   08/05/23 2100  Psych Admission Type (Psych Patients Only)  Admission Status Involuntary  Psychosocial Assessment  Patient Complaints Anxiety  Eye Contact Fair  Facial Expression Anxious;Worried  Affect Appropriate to circumstance  Surveyor, minerals Activity Slow  Appearance/Hygiene Unremarkable  Behavior Characteristics Cooperative  Mood Depressed;Anxious  Aggressive Behavior  Effect No apparent injury  Thought Process  Coherency Circumstantial  Content Blaming others  Delusions None reported or observed  Perception WDL  Hallucination None reported or observed  Judgment Poor  Confusion None  Danger to Self  Current suicidal ideation? Denies  Danger to Others  Danger to Others None reported or observed

## 2023-08-06 NOTE — Group Note (Signed)
Date:  08/06/2023 Time:  11:30 AM  Group Topic/Focus:  Goals Group:   The focus of this group is to help patients establish daily goals to achieve during treatment and discuss how the patient can incorporate goal setting into their daily lives to aide in recovery.    Participation Level:  Active  Participation Quality:  Appropriate  Affect:  Appropriate  Cognitive:  Appropriate  Insight: Appropriate  Engagement in Group:  Engaged  Modes of Intervention:  Discussion  Additional Comments:     Ronald Leach 08/06/2023, 11:30 AM

## 2023-08-06 NOTE — Progress Notes (Signed)
   08/06/23 0630  15 Minute Checks  Location Dayroom  Visual Appearance Calm  Behavior Composed  Sleep (Behavioral Health Patients Only)  Calculate sleep? (Click Yes once per 24 hr at 0600 safety check) Yes  Documented sleep last 24 hours 9.75

## 2023-08-06 NOTE — Discharge Summary (Signed)
Physician Discharge Summary Note  Patient:  Ronald Leach is an 22 y.o., male MRN:  [id number] DOB:  [birth year] Patient phone:  [phone] (home)  Patient address:   [address] [place] [address],  Total Time spent with patient: 20 minutes  Date of Admission:  08/01/2023 Date of Discharge: 08/06/2023  Reason for Admission:   Ronald Leach is a 22 yr old male who presented on 9/20 to [facility] under IVC for rapid mood swings and SI.  PPHx is significant for Bipolar Disorder, Meth Abuse, and Medication Non-Compliance, and 2 Prior Psychiatric Hospitalizations (last- [facility] 05/2023), and no history of Suicide Attempts or Self Injurious Behavior.   Ronald Leach is a 22 year old male with a history of bipolar disorder who is on his third admission initiated by his aunt.  Apparently his admission was initiated after she heard a recording from Ronald Leach's boyfriend threatening to kill himself and the boyfriend.  Apparently has been abusing methamphetamine at least twice a day and has not been taking any medications.  [facility] with law enforcement on an IVC. As per the IVC papers the patient has been noncompliant with the medications.  He has been exhibiting rapid mood swings, threatening to kill his boyfriend and himself.  He has not been eating or sleeping.  Apparently also has a sexually-transmitted disease and has been refusing to get medical treatment and was willfully engaging in unprotected sex. Circumstances leading to the IVC include an unstable relationship with his current boyfriend.  Apparently there is a 20-year age difference and he feels that it contributes to a dynamic for struggle.  He admits that he and his boyfriend both engage in using amphetamine together.  He feels that there is some manipulation regarding his boyfriend taking out the IVC papers because he knew that he was recording him.  The patient lives with his father and is currently unemployed.  The  patient's boyfriend also lives with his father and is unemployed.  They do not have a place of their own.  He is on academic probation from [facility].  He is not currently under the care of any psychiatrist or therapist and reports the prior exposure to lithium and Depakote both of which made him lose his emotions and he does not want to start them back.  Principal Problem: Bipolar I disorder, most recent episode (or current) manic (HCC) Discharge Diagnoses: Principal Problem:   Bipolar I disorder, most recent episode (or current) manic (HCC) Active Problems:   Methamphetamine dependence (HCC)   Past Psychiatric History: Bipolar Disorder, Meth Abuse, and Medication Non-Compliance, and 2 Prior Psychiatric Hospitalizations (last- [facility] 05/2023), and no history of Suicide Attempts or Self Injurious Behavior.   Past Medical History: History reviewed. No pertinent past medical history. History reviewed. No pertinent surgical history. Family History: History reviewed. No pertinent family history. Family Psychiatric  History:  Mother- EtOH, Pain Pill Abuse, 2 Suicide Attempts Father- EtOH Abuse Maternal Aunt- Bipolar Disorder, Polysubstance Abuse, 6 Suicide Attempts Maternal Uncle- Bipolar Disorder Paternal Aunt- Anxiety  Social History:  Social History   Substance and Sexual Activity  Alcohol Use None     Social History   Substance and Sexual Activity  Drug Use Never    Social History   Socioeconomic History   Marital status: Single    Spouse name: Not on file   Number of children: Not on file   Years of education: Not on file   Highest education level: Not  on file  Occupational History   Not on file  Tobacco Use   Smoking status: Never   Smokeless tobacco: Never  Substance and Sexual Activity   Alcohol use: Not on file   Drug use: Never   Sexual activity: Not on file  Other Topics Concern   Not on file  Social History Narrative   Not on file   Social Determinants of  Health   Financial Resource Strain: Not on file  Food Insecurity: Patient Declined (08/01/2023)   Hunger Vital Sign    Worried About Running Out of Food in the Last Year: Patient declined    Ran Out of Food in the Last Year: Patient declined  Transportation Needs: Patient Declined (08/01/2023)   PRAPARE - Administrator, Civil Service (Medical): Patient declined    Lack of Transportation (Non-Medical): Patient declined  Physical Activity: Not on file  Stress: Not on file  Social Connections: Not on file    [facility] Course:   During the patient's hospitalization, patient had extensive initial psychiatric evaluation, and follow-up psychiatric evaluations every day.  Psychiatric diagnoses provided upon initial assessment: Bipolar Disorder, Current Episode Manic, Meth Abuse   Patient's psychiatric medications were adjusted on admission: Started on Abilify.   During the hospitalization, other adjustments were made to the patient's psychiatric medication regimen: His Abilify was titrated during his hospitalization.   Patient's care was discussed during the interdisciplinary team meeting every day during the hospitalization.  The patient is not having significant side effects to prescribed psychiatric medication.  Possible mild constipation from the Hydroxyzine.   Gradually, patient started adjusting to milieu. The patient was evaluated each day by a clinical provider to ascertain response to treatment. Improvement was noted by the patient's report of decreasing symptoms, improved sleep and appetite, affect, medication tolerance, behavior, and participation in unit programming.  Patient was asked each day to complete a self inventory noting mood, mental status, pain, new symptoms, anxiety and concerns.   Symptoms were reported as significantly decreased or resolved completely by discharge.  The patient reports that their mood is stable.  The patient denied having suicidal thoughts  for more than 48 hours prior to discharge.  Patient denies having homicidal thoughts.  Patient denies having auditory hallucinations.  Patient denies any visual hallucinations or other symptoms of psychosis.  The patient was motivated to continue taking medication with a goal of continued improvement in mental health.   The patient reports their target psychiatric symptoms of depression and SI  responded well to the psychiatric medications, and the patient reports overall benefit other psychiatric hospitalization. Supportive psychotherapy was provided to the patient. The patient also participated in regular group therapy while hospitalized. Coping skills, problem solving as well as relaxation therapies were also part of the unit programming.  Labs were reviewed with the patient, and abnormal results were discussed with the patient.  The patient is able to verbalize their individual safety plan to this provider.  # It is recommended to the patient to continue psychiatric medications as prescribed, after discharge from the [facility].    # It is recommended to the patient to follow up with your outpatient psychiatric provider and PCP.  # It was discussed with the patient, the impact of alcohol, drugs, tobacco have been there overall psychiatric and medical wellbeing, and total abstinence from substance use was recommended the patient.ed.  # Prescriptions provided or sent directly to preferred pharmacy at discharge. Patient agreeable to plan. Given opportunity to ask  questions. Appears to feel comfortable with discharge.    # In the event of worsening symptoms, the patient is instructed to call the crisis hotline, 911 and or go to the nearest ED for appropriate evaluation and treatment of symptoms. To follow-up with primary care provider for other medical issues, concerns and or health care needs  # Patient was discharged home with a plan to follow up as noted below.    On day of discharge he  reports he is feeling much better and more upbeat.  He reports that he is looking forward to starting therapy with a new psychologist in [place] that takes his insurance and specializes in many of the areas he needs help with.  He reports that he is doing much better and is looking forward to going home.  He reports no side effects to his Abilify.  He reports he is still having some mild constipation and asks if it could be due to the hydroxyzine.  Discussed with him that it can cause some constipation and so can continue taking Colace while taking the hydroxyzine but since he has been taking it less and less this may not be needed and he was agreeable with this.  He reports sleep is good.  He reports his appetite is doing good.  He reports no SI, HI, or AVH.  He reports no other concerns at present.  He was discharged home.   Physical Findings: AIMS: Facial and Oral Movements Muscles of Facial Expression: None, normal Lips and Perioral Area: None, normal Jaw: None, normal Tongue: None, normal,Extremity Movements Upper (arms, wrists, hands, fingers): None, normal Lower (legs, knees, ankles, toes): None, normal, Trunk Movements Neck, shoulders, hips: None, normal, Overall Severity Severity of abnormal movements (highest score from questions above): None, normal Incapacitation due to abnormal movements: None, normal Patient's awareness of abnormal movements (rate only patient's report): No Awareness, Dental Status Current problems with teeth and/or dentures?: No Does patient usually wear dentures?: No  CIWA:    COWS:     Musculoskeletal: Strength & Muscle Tone: within normal limits Gait & Station: normal Patient leans: N/A   Psychiatric Specialty Exam:  Presentation  General Appearance:  Appropriate for Environment; Casual  Eye Contact: Good  Speech: Clear and Coherent; Normal Rate  Speech Volume: Normal  Handedness: Right   Mood and Affect   Mood: Euthymic  Affect: Appropriate; Congruent   Thought Process  Thought Processes: Coherent; Goal Directed  Descriptions of Associations:Intact  Orientation:Full (Time, Place and Person)  Thought Content:Logical; WDL  History of Schizophrenia/Schizoaffective disorder:No  Duration of Psychotic Symptoms:No data recorded Hallucinations:Hallucinations: None  Ideas of Reference:None  Suicidal Thoughts:Suicidal Thoughts: No  Homicidal Thoughts:Homicidal Thoughts: No   Sensorium  Memory: Immediate Fair; Recent Fair  Judgment: Good  Insight: Good   Executive Functions  Concentration: Good  Attention Span: Good  Recall: Good  Fund of Knowledge: Good  Language: Good   Psychomotor Activity  Psychomotor Activity: Psychomotor Activity: Normal   Assets  Assets: Communication Skills; Desire for Improvement; Resilience; Physical Health   Sleep  Sleep: Sleep: Good Number of Hours of Sleep: 9.75    Physical Exam: Physical Exam Vitals and nursing note reviewed.  Constitutional:      General: He is not in acute distress.    Appearance: Normal appearance. He is normal weight. He is not ill-appearing or toxic-appearing.  HENT:     Head: Normocephalic and atraumatic.  Pulmonary:     Effort: Pulmonary effort is normal.  Musculoskeletal:  General: Normal range of motion.  Neurological:     General: No focal deficit present.     Mental Status: He is alert.    Review of Systems  Respiratory:  Negative for cough and shortness of breath.   Cardiovascular:  Negative for chest pain.  Gastrointestinal:  Positive for constipation (mild). Negative for abdominal pain, diarrhea, nausea and vomiting.  Neurological:  Negative for dizziness, weakness and headaches.  Psychiatric/Behavioral:  Negative for depression, hallucinations and suicidal ideas. The patient is not nervous/anxious.    Blood pressure 114/81, pulse 90, temperature 98.2 F (36.8  C), temperature source Oral, resp. rate 16, height 5\' 7"  (1.702 m), weight 55.3 kg, SpO2 100%. Body mass index is 19.11 kg/m.   Social History   Tobacco Use  Smoking Status Never  Smokeless Tobacco Never   Tobacco Cessation:  A prescription for an FDA-approved tobacco cessation medication provided at discharge   Blood Alcohol level:  Lab Results  Component Value Date   ETH <10 07/31/2023    Metabolic Disorder Labs:  No results found for: "HGBA1C", "MPG" No results found for: "PROLACTIN" Lab Results  Component Value Date   CHOL 199 08/04/2023   TRIG 93 08/04/2023   HDL 52 08/04/2023   CHOLHDL 3.8 08/04/2023   VLDL 19 08/04/2023   LDLCALC 128 (H) 08/04/2023    See Psychiatric Specialty Exam and Suicide Risk Assessment completed by Attending Physician prior to discharge.  Discharge destination:  Home  Is patient on multiple antipsychotic therapies at discharge:  No   Has Patient had three or more failed trials of antipsychotic monotherapy by history:  No  Recommended Plan for Multiple Antipsychotic Therapies: NA  Discharge Instructions     Diet - low sodium heart healthy   Complete by: As directed    Increase activity slowly   Complete by: As directed       Allergies as of 08/06/2023   No Known Allergies      Medication List     STOP taking these medications    Descovy 200-25 MG tablet Generic drug: emtricitabine-tenofovir AF   divalproex 500 MG 24 hr tablet Commonly known as: DEPAKOTE ER   lithium carbonate 300 MG ER tablet Commonly known as: LITHOBID   sertraline 50 MG tablet Commonly known as: ZOLOFT       TAKE these medications      Indication  ARIPiprazole 10 MG tablet Commonly known as: ABILIFY Take 1 tablet (10 mg total) by mouth daily. Start taking on: August 07, 2023  Indication: Manic Phase of Manic-Depression   docusate sodium 100 MG capsule Commonly known as: COLACE Take 1 capsule (100 mg total) by mouth 2 (two) times  daily.  Indication: Constipation   hydrOXYzine 25 MG tablet Commonly known as: ATARAX Take 1 tablet (25 mg total) by mouth 3 (three) times daily as needed for anxiety.  Indication: Feeling Anxious   nicotine polacrilex 2 MG gum Commonly known as: NICORETTE Take 1 each (2 mg total) by mouth as needed for smoking cessation.  Indication: Nicotine Addiction        Follow-up Information     [facility] Follow up on 08/14/2023.   Why: You have a [facility] follow up appointment for medication management and/or therapy services on 08/14/23 at 8:00 am.  This will be a Virtual telehealth appt. Contact information: [address]  [address] [place] [address] [phone]                 Follow-up  recommendations/Comments:   Activity: as tolerated   Diet: heart healthy   Other: -Follow-up with your outpatient psychiatric provider -instructions on appointment date, time, and address (location) are provided to you in discharge paperwork.   -Take your psychiatric medications as prescribed at discharge - instructions are provided to you in the discharge paperwork   -Follow-up with outpatient primary care doctor and other specialists -for management of chronic medical disease, including: Elevated Lipid Panel.  Substance Abuse treatment.   -Testing: Follow-up with outpatient provider for abnormal lab results: Elevated Lipid Panel.   -Recommend abstinence from alcohol, tobacco, and other illicit drug use at discharge.    -If your psychiatric symptoms recur, worsen, or if you have side effects to your psychiatric medications, call your outpatient psychiatric provider, 911, [phone] or go to the nearest emergency department.   -If suicidal thoughts recur, call your outpatient psychiatric provider, 911, [phone] or go to the nearest emergency department.   Signed: Lauro Franklin, MD 08/06/2023, 9:10 AM

## 2023-08-06 NOTE — Plan of Care (Signed)
  Problem: Education: Goal: Knowledge of [facility] Education information/materials will improve Outcome: Progressing   Problem: Education: Goal: Mental status will improve Outcome: Progressing   Problem: Coping: Goal: Ability to verbalize frustrations and anger appropriately will improve Outcome: Progressing   Problem: Health Behavior/Discharge Planning: Goal: Compliance with treatment plan for underlying cause of condition will improve Outcome: Progressing   Problem: Safety: Goal: Periods of time without injury will increase Outcome: Progressing

## 2023-08-06 NOTE — Progress Notes (Signed)
Pt discharged to lobby- boyfriend present to pick up patient. Pt was stable and appreciative at that time. All papers and prescriptions were given and valuables returned. Verbal understanding expressed. Denies SI/HI and A/VH. Pt given opportunity to express concerns and ask questions.

## 2023-09-03 ENCOUNTER — Telehealth: Payer: MEDICAID | Admitting: Physician Assistant

## 2023-09-03 DIAGNOSIS — Z760 Encounter for issue of repeat prescription: Secondary | ICD-10-CM | POA: Diagnosis not present

## 2023-09-03 MED ORDER — HYDROXYZINE PAMOATE 25 MG PO CAPS: 25.0000 mg | ORAL_CAPSULE | Freq: Three times a day (TID) | ORAL | 0 refills | Status: AC | PRN

## 2023-09-03 NOTE — Patient Instructions (Signed)
  Clement Husbands, thank you for joining [facility], PA-C for today's virtual visit.  While this provider is not your primary care provider (PCP), if your PCP is located in our provider database this encounter information will be shared with them immediately following your visit.   Please review the info at the bottom of this message so that you can have your other medications refilled.   A [facility] MyChart account gives you access to today's visit and all your visits, tests, and labs performed at [facility] " click here if you don't have a [facility] MyChart account or go to [email or web address]  Consent: (Patient) JYQUAN HARDER provided verbal consent for this virtual visit at the beginning of the encounter.  Current Medications:  Current Outpatient Medications:    ARIPiprazole (ABILIFY) 10 MG tablet, Take 1 tablet (10 mg total) by mouth daily., Disp: 30 tablet, Rfl: 0   docusate sodium (COLACE) 100 MG capsule, Take 1 capsule (100 mg total) by mouth 2 (two) times daily., Disp: , Rfl:    hydrOXYzine (ATARAX) 25 MG tablet, Take 1 tablet (25 mg total) by mouth 3 (three) times daily as needed for anxiety., Disp: 30 tablet, Rfl: 0   nicotine polacrilex (NICORETTE) 2 MG gum, Take 1 each (2 mg total) by mouth as needed for smoking cessation., Disp: 40 tablet, Rfl: 0   Medications ordered in this encounter:  No orders of the defined types were placed in this encounter.    *If you need refills on other medications prior to your next appointment, please contact your pharmacy*  Follow-Up: Call back or seek an in-person evaluation if the symptoms worsen or if the condition fails to improve as anticipated.  [facility] [phone]  Other Instructions  Please see the information below so that you may be seen and have your other medications refilled.   [facility] [address], [place] [address] [phone] No  appointment needed   If you have been instructed to have an in-person evaluation today at a local Urgent Care facility, please use the link below. It will take you to a list of all of our available [facility], including address, phone number and hours of operation. Please do not delay care.  [facility]  If you or a family member do not have a primary care provider, use the link below to schedule a visit and establish care. When you choose a [facility] primary care physician or advanced practice provider, you gain a long-term partner in health. Find a Primary Care Provider  Learn more about [facility]'s in-office and virtual care options: [facility] - Get Care Now

## 2023-09-03 NOTE — Progress Notes (Signed)
Virtual Visit Consent   R[facility], you are scheduled for a virtual visit with a [facility] provider today. Just as with appointments in the office, your consent must be obtained to participate. Your consent will be active for this visit and any virtual visit you may have with one of our providers in the next 365 days. If you have a MyChart account, a copy of this consent can be sent to you electronically.  As this is a virtual visit, video technology does not allow for your provider to perform a traditional examination. This may limit your provider's ability to fully assess your condition. If your provider identifies any concerns that need to be evaluated in person or the need to arrange testing (such as labs, EKG, etc.), we will make arrangements to do so. Although advances in technology are sophisticated, we cannot ensure that it will always work on either your end or our end. If the connection with a video visit is poor, the visit may have to be switched to a telephone visit. With either a video or telephone visit, we are not always able to ensure that we have a secure connection.  By engaging in this virtual visit, you consent to the provision of healthcare and authorize for your insurance to be billed (if applicable) for the services provided during this visit. Depending on your insurance coverage, you may receive a charge related to this service.  I need to obtain your verbal consent now. Are you willing to proceed with your visit today? [facility] has provided verbal consent on 09/03/2023 for a virtual visit (video or telephone). Laure Kidney, [place]  Date: 09/03/2023 4:31 PM  Virtual Visit via Video Note   I, Laure Kidney, connected with  Ronald [facility]ach  ([id number], 2001-11-01) on 09/03/23 at  4:15 PM EDT by a video-enabled telemedicine application and verified that I am speaking with the correct person using two identifiers.  Location: Patient: Virtual Visit Location  Patient: Home Provider: Virtual Visit Location Provider: Home Office   I discussed the limitations of evaluation and management by telemedicine and the availability of in person appointments. The patient expressed understanding and agreed to proceed.    History of Present Illness: [facility] is a 22 y.o. who identifies as a male who was assigned male at birth, and is being seen today for a med refill.  HPI: Medication Refill This is a chronic problem. The current episode started today. The problem has been unchanged. Associated symptoms comments: None. The symptoms are aggravated by stress. The treatment provided significant relief.    Problems:  Patient Active Problem List   Diagnosis Date Noted   Bipolar I disorder, most recent episode (or current) manic (HCC) 08/04/2023   Depression 08/01/2023   Bipolar affective disorder, depressed, severe (HCC) 07/31/2023   Methamphetamine dependence (HCC) 07/31/2023    Allergies: No Known Allergies Medications:  Current Outpatient Medications:    ARIPiprazole (ABILIFY) 10 MG tablet, Take 1 tablet (10 mg total) by mouth daily., Disp: 30 tablet, Rfl: 0   docusate sodium (COLACE) 100 MG capsule, Take 1 capsule (100 mg total) by mouth 2 (two) times daily., Disp: , Rfl:    hydrOXYzine (ATARAX) 25 MG tablet, Take 1 tablet (25 mg total) by mouth 3 (three) times daily as needed for anxiety., Disp: 30 tablet, Rfl: 0   nicotine polacrilex (NICORETTE) 2 MG gum, Take 1 each (2 mg total) by mouth as needed for smoking cessation., Disp: 40 tablet, Rfl:  0  Observations/Objective: Patient is well-developed, well-nourished in no acute distress.  Resting comfortably  at home.  Head is normocephalic, atraumatic.  No labored breathing.  Speech is clear and coherent with logical content.  Patient is alert and oriented at baseline.  Denies SI/HI  Assessment and Plan: 1. Encounter for medication refill Will refill hydroxyzine as prescribed.  -  Information to be sent via mychart for follow up/refill of his Abilify by going to [facility] or Psych ED.  Follow Up Instructions: I discussed the assessment and treatment plan with the patient. The patient was provided an opportunity to ask questions and all were answered. The patient agreed with the plan and demonstrated an understanding of the instructions.  A copy of instructions were sent to the patient via MyChart unless otherwise noted below.    The patient was advised to call back or seek an in-person evaluation if the symptoms worsen or if the condition fails to improve as anticipated.    Laure Kidney, PA-C

## 2023-09-08 ENCOUNTER — Emergency Department (HOSPITAL_COMMUNITY)
Admission: EM | Admit: 2023-09-08 | Discharge: 2023-09-08 | Discharge status: Against Medical Advice | Payer: MEDICAID | Source: Home / Self Care

## 2024-02-01 ENCOUNTER — Emergency Department
Admission: EM | Admit: 2024-02-01 | Discharge: 2024-02-02 | Disposition: A | Discharge status: Other Acute Inpatient Hospital | Payer: MEDICAID | Attending: Emergency Medicine | Admitting: Emergency Medicine

## 2024-02-01 DIAGNOSIS — F1510 Other stimulant abuse, uncomplicated: Secondary | ICD-10-CM | POA: Diagnosis present

## 2024-02-01 DIAGNOSIS — R451 Restlessness and agitation: Secondary | ICD-10-CM

## 2024-02-01 DIAGNOSIS — F1210 Cannabis abuse, uncomplicated: Secondary | ICD-10-CM | POA: Insufficient documentation

## 2024-02-01 DIAGNOSIS — F1910 Other psychoactive substance abuse, uncomplicated: Secondary | ICD-10-CM

## 2024-02-01 HISTORY — DX: Other psychoactive substance abuse, uncomplicated: F19.10

## 2024-02-01 HISTORY — DX: Bipolar disorder, unspecified: F31.9

## 2024-02-01 LAB — CBC
HCT: 47.3 % (ref 39.0–52.0)
Hemoglobin: 15.7 g/dL (ref 13.0–17.0)
MCH: 31.5 pg (ref 26.0–34.0)
MCHC: 33.2 g/dL (ref 30.0–36.0)
MCV: 95 fL (ref 80.0–100.0)
Platelets: 337 K/uL (ref 150–400)
RBC: 4.98 MIL/uL (ref 4.22–5.81)
RDW: 12.6 % (ref 11.5–15.5)
WBC: 13 K/uL — ABNORMAL HIGH (ref 4.0–10.5)
nRBC: 0 % (ref 0.0–0.2)

## 2024-02-01 LAB — COMPREHENSIVE METABOLIC PANEL
ALT: 12 U/L (ref 0–44)
AST: 16 U/L (ref 15–41)
Albumin: 4.3 g/dL (ref 3.5–5.0)
Alkaline Phosphatase: 70 U/L (ref 38–126)
Anion gap: 10 (ref 5–15)
BUN: 8 mg/dL (ref 6–20)
CO2: 28 mmol/L (ref 22–32)
Calcium: 9.4 mg/dL (ref 8.9–10.3)
Chloride: 102 mmol/L (ref 98–111)
Creatinine, Ser: 0.83 mg/dL (ref 0.61–1.24)
GFR, Estimated: >60 mL/min (ref >60)
Glucose, Bld: 92 mg/dL (ref 70–99)
Potassium: 4 mmol/L (ref 3.5–5.1)
Sodium: 140 mmol/L (ref 135–145)
Total Bilirubin: 0.7 mg/dL (ref 0.0–1.2)
Total Protein: 7 g/dL (ref 6.5–8.1)

## 2024-02-01 LAB — URINE DRUG SCREEN, QUALITATIVE (ARMC ONLY)
Amphetamines, Ur Screen: POSITIVE — AB
Barbiturates, Ur Screen: NOT DETECTED
Benzodiazepine, Ur Scrn: NOT DETECTED
Cannabinoid 50 Ng, Ur ~~LOC~~: POSITIVE — AB
Cocaine Metabolite,Ur ~~LOC~~: NOT DETECTED
MDMA (Ecstasy)Ur Screen: NOT DETECTED
Methadone Scn, Ur: NOT DETECTED
Opiate, Ur Screen: NOT DETECTED
Phencyclidine (PCP) Ur S: NOT DETECTED
Tricyclic, Ur Screen: NOT DETECTED

## 2024-02-01 LAB — ACETAMINOPHEN LEVEL: Acetaminophen (Tylenol), Serum: <10 ug/mL — ABNORMAL LOW (ref 10–30)

## 2024-02-01 LAB — SALICYLATE LEVEL: Salicylate Lvl: <7 mg/dL — ABNORMAL LOW (ref 7.0–30.0)

## 2024-02-01 LAB — ETHANOL: Alcohol, Ethyl (B): <10 mg/dL

## 2024-02-01 NOTE — ED Triage Notes (Signed)
 Pt presents to the ED via [facility] department. Pt IVC'd for reckless behavior. Pt has a hx of bipolar. Pt has not been taking his medications as prescribed. Pt also has a hx of substance abuse disorder. Pt dressed out at time of triage. Family is concerned with patients drug use and reckless behaviors. Belongings include the following: Brown pants Plaid shirt Crocs Black socks White and yellow bracelet Orange ring Blue Underwear Necklace

## 2024-02-01 NOTE — ED Provider Notes (Signed)
   [facility] Provider Note    Event Date/Time   First MD Initiated Contact with Patient 02/01/24 2008     (approximate)  History   Chief Complaint: IVC  HPI  Ronald Leach is a 23 y.o. male with a past medical history of bipolar, methamphetamine abuse, presents to the emergency department under IVC.  According to report patient has not been taking any of his prescription medications has been using substances and has been acting erratic.  Here patient is calm and cooperative.  Patient mitts he has not been taking any of his prescription medications.  He also admits to smoking methamphetamine last use was around 12 PM today.  Patient denies any medical complaints.  Denies any SI or HI.  Physical Exam   Triage Vital Signs: ED Triage Vitals  Encounter Vitals Group     BP 02/01/24 1759 120/89     Systolic BP Percentile --      Diastolic BP Percentile --      Pulse Rate 02/01/24 1759 95     Resp 02/01/24 1759 18     Temp 02/01/24 1759 97.9 F (36.6 C)     Temp Source 02/01/24 1759 Oral     SpO2 02/01/24 1759 100 %     Weight 02/01/24 1755 145 lb (65.8 kg)     Height 02/01/24 1755 5\' 7"  (1.702 m)     Head Circumference --      Peak Flow --      Pain Score 02/01/24 1755 0     Pain Loc --      Pain Education --      Exclude from Growth Chart --     Most recent vital signs: Vitals:   02/01/24 1759  BP: 120/89  Pulse: 95  Resp: 18  Temp: 97.9 F (36.6 C)  SpO2: 100%    General: Awake, no distress.  CV:  Good peripheral perfusion.  Regular rate and rhythm  Resp:  Normal effort.  Equal breath sounds bilaterally.  Abd:  No distention.  Soft, nontender.  No rebound or guarding.  ED Results / Procedures / Treatments   MEDICATIONS ORDERED IN ED: Medications - No data to display   IMPRESSION / MDM / ASSESSMENT AND PLAN / ED COURSE  I reviewed the triage vital signs and the nursing notes.  Patient's presentation is most consistent with acute  presentation with potential threat to life or bodily function.  Patient presents emergency department for erratic behavior under IVC patient admits to using methamphetamine as well as not taking his prescription medications.  However patient denies any SI or HI, currently calm and cooperative, reassuring vital signs.  Reassuring physical exam.  No medical complaints.  Patient's lab work today shows a slight leukocytosis otherwise reassuring CBC, negative alcohol salicylate and acetaminophen levels, reassuring chemistry.  Will maintain the IVC until the patient can be adequately evaluated by psychiatry.  Patient agreeable to plan of care.  FINAL CLINICAL IMPRESSION(S) / ED DIAGNOSES   Substance abuse Agitation   Note:  This document was prepared using Dragon voice recognition software and may include unintentional dictation errors.   Minna Antis, MD 02/01/24 2015

## 2024-02-01 NOTE — ED Notes (Signed)
 Pt. To BHU from ED ambulatory without difficulty, to room  BHU 1. Report from [facility]. Pt. Is alert and oriented, warm and dry in no distress. Pt. Denies SI, HI, and AVH. Pt. Calm and cooperative. Pt. Made aware of security cameras,rules and Q15 minute rounds. Pt. Encouraged to let Nursing staff know of any concerns or needs.   ENVIRONMENTAL ASSESSMENT Potentially harmful objects out of patient reach: Yes.   Personal belongings secured: Yes.   Patient dressed in hospital provided attire only: Yes.   Plastic bags out of patient reach: Yes.   Patient care equipment (cords, cables, call bells, lines, and drains) shortened, removed, or accounted for: Yes.   Equipment and supplies removed from bottom of stretcher: Yes.   Potentially toxic materials out of patient reach: Yes.   Sharps container removed or out of patient reach: Yes.

## 2024-02-01 NOTE — ED Notes (Addendum)
 Pt given sandwich tray and drink.
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
## 2024-02-01 NOTE — ED Notes (Signed)
 moved pt to bhu.

## 2024-02-01 NOTE — ED Notes (Signed)
 psych consult ordered/pending.

## 2024-02-01 NOTE — ED Notes (Signed)
 ivc prior to arrival.

## 2024-02-02 ENCOUNTER — Other Ambulatory Visit (HOSPITAL_COMMUNITY)
Admission: EM | Admit: 2024-02-02 | Discharge: 2024-02-09 | Disposition: A | Discharge status: Home / Self Care | Payer: MEDICAID | Attending: Psychiatry | Admitting: Psychiatry

## 2024-02-02 DIAGNOSIS — Z560 Unemployment, unspecified: Secondary | ICD-10-CM | POA: Insufficient documentation

## 2024-02-02 DIAGNOSIS — F1510 Other stimulant abuse, uncomplicated: Secondary | ICD-10-CM

## 2024-02-02 DIAGNOSIS — F319 Bipolar disorder, unspecified: Secondary | ICD-10-CM | POA: Insufficient documentation

## 2024-02-02 DIAGNOSIS — B20 Human immunodeficiency virus [HIV] disease: Secondary | ICD-10-CM | POA: Insufficient documentation

## 2024-02-02 DIAGNOSIS — F1910 Other psychoactive substance abuse, uncomplicated: Secondary | ICD-10-CM | POA: Insufficient documentation

## 2024-02-02 DIAGNOSIS — Z79899 Other long term (current) drug therapy: Secondary | ICD-10-CM | POA: Insufficient documentation

## 2024-02-02 DIAGNOSIS — F3110 Bipolar disorder, current episode manic without psychotic features, unspecified: Secondary | ICD-10-CM

## 2024-02-02 DIAGNOSIS — F17290 Nicotine dependence, other tobacco product, uncomplicated: Secondary | ICD-10-CM | POA: Insufficient documentation

## 2024-02-02 DIAGNOSIS — Z91148 Patient's other noncompliance with medication regimen for other reason: Secondary | ICD-10-CM | POA: Diagnosis not present

## 2024-02-02 MED ORDER — OLANZAPINE 10 MG IM SOLR: 5.0000 mg | Freq: Three times a day (TID) | INTRAMUSCULAR | Status: DC | PRN

## 2024-02-02 MED ORDER — ONDANSETRON 4 MG PO TBDP: 4.0000 mg | ORAL_TABLET | Freq: Four times a day (QID) | ORAL | Status: AC | PRN

## 2024-02-02 MED ORDER — HYDROXYZINE HCL 25 MG PO TABS
25.0000 mg | ORAL_TABLET | Freq: Four times a day (QID) | ORAL | Status: DC | PRN
  Administered 2024-02-03: 25 mg via ORAL
  Filled 2024-02-02: qty 1

## 2024-02-02 MED ORDER — OLANZAPINE 10 MG IM SOLR: 10.0000 mg | Freq: Three times a day (TID) | INTRAMUSCULAR | Status: DC | PRN

## 2024-02-02 MED ORDER — MAGNESIUM HYDROXIDE 400 MG/5ML PO SUSP: 30.0000 mL | Freq: Every day | ORAL | Status: DC | PRN

## 2024-02-02 MED ORDER — CLONIDINE HCL 0.1 MG PO TABS: 0.1000 mg | ORAL_TABLET | Freq: Every day | ORAL | Status: DC

## 2024-02-02 MED ORDER — METHOCARBAMOL 500 MG PO TABS: 500.0000 mg | ORAL_TABLET | Freq: Three times a day (TID) | ORAL | Status: AC | PRN

## 2024-02-02 MED ORDER — DICYCLOMINE HCL 20 MG PO TABS: 20.0000 mg | ORAL_TABLET | Freq: Four times a day (QID) | ORAL | Status: AC | PRN

## 2024-02-02 MED ORDER — ALUM & MAG HYDROXIDE-SIMETH 200-200-20 MG/5ML PO SUSP: 30.0000 mL | ORAL | Status: DC | PRN

## 2024-02-02 MED ORDER — CLONIDINE HCL 0.1 MG PO TABS: 0.1000 mg | ORAL_TABLET | ORAL | Status: DC

## 2024-02-02 MED ORDER — CLONIDINE HCL 0.1 MG PO TABS
0.1000 mg | ORAL_TABLET | Freq: Four times a day (QID) | ORAL | Status: DC
  Administered 2024-02-02 – 2024-02-04 (×6): 0.1 mg via ORAL
  Filled 2024-02-02 (×6): qty 1

## 2024-02-02 MED ORDER — OLANZAPINE 5 MG PO TBDP: 5.0000 mg | ORAL_TABLET | Freq: Three times a day (TID) | ORAL | Status: DC | PRN

## 2024-02-02 MED ORDER — LOPERAMIDE HCL 2 MG PO CAPS: 2.0000 mg | ORAL_CAPSULE | ORAL | Status: AC | PRN

## 2024-02-02 MED ORDER — ACETAMINOPHEN 325 MG PO TABS: 650.0000 mg | ORAL_TABLET | Freq: Four times a day (QID) | ORAL | Status: DC | PRN

## 2024-02-02 MED ORDER — NAPROXEN 500 MG PO TABS
500.0000 mg | ORAL_TABLET | Freq: Two times a day (BID) | ORAL | Status: DC | PRN
Start: 2024-02-02 — End: 2024-02-07

## 2024-02-02 NOTE — ED Notes (Signed)
 Called for sheriff's transport to [facility] [place]  [address] [place]  [address]

## 2024-02-02 NOTE — ED Notes (Signed)
Patient received dinner tray 

## 2024-02-02 NOTE — ED Notes (Signed)
 Attempted to call report @ [phone] no answer

## 2024-02-02 NOTE — ED Notes (Signed)
 Patient transferred from [facility] ED to [facility] requesting assistance in detox from meth. Patient tearful and voices anxiety but calm and cooperative throughout interview process. Oriented to unit. Meal and drink offered. Patient alert & oriented x4. Denies intent to harm self or others when asked. Denies A/VH. Patient denies any physical complaints when asked. No acute distress noted. Support and encouragement provided. Routine safety checks conducted per facility protocol. Encouraged patient to notify staff if any thoughts of harm towards self or others arise. Patient verbalizes understanding and agreement.
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
## 2024-02-02 NOTE — ED Notes (Signed)
Skin assessment completed

## 2024-02-02 NOTE — ED Notes (Signed)
 Pt A/o x4 verbalizes agreement to attend inpatient Tx, pt denies current SI/HI, endorses increased depression. Denies Auditory & visual hallucinations. Pt transported to [facility] via [facility].

## 2024-02-02 NOTE — ED Notes (Signed)
 IVC/pending psych consult

## 2024-02-02 NOTE — ED Provider Notes (Signed)
 Emergency Medicine Observation Re-evaluation Note  Ronald Leach is a 23 y.o. male, seen on rounds today.  Pt initially presented to the ED for complaints of IVC Currently, the patient is currently sleeping without distress.  Physical Exam  BP 122/79 (BP Location: Left Arm)   Pulse (!) 108   Temp 98.5 F (36.9 C) (Oral)   Resp 19   Ht 5\' 7"  (1.702 m)   Wt 65.8 kg   SpO2 100%   BMI 22.71 kg/m  Physical Exam Asleep with even unlabored respirations ED Course / MDM  EKG:   I have reviewed the labs performed to date as well as medications administered while in observation.  Recent changes in the last 24 hours include awaiting psychiatry consultation.  Plan  Current plan is for awaiting psychiatry consult disposition.    Sharyn Creamer, MD 02/02/24 [phone]

## 2024-02-02 NOTE — BH Assessment (Addendum)
 Patient has been accepted to [facility] on today 02/02/24.  Patient assigned to room 158 Accepting physician is Dr. Marval Regal.  Call report to [phone].  Representative was [place].   ER Staff is aware of it:  Luann, ER Secretary  Dr. Fanny Bien, ER MD  Florentina Addison, Patient's Nurse     Patient's Family/Support System Kathryn Linarez [phone]) has been updated as well.  Address: [address]                [place], [place]

## 2024-02-02 NOTE — Group Note (Signed)
 Group Topic: Healthy Self Image and Positive Change  Group Date: 02/02/2024 Start Time: 2000 End Time: 2200 Facilitators: Rae Lips B  Department: [facility]  Number of Participants: 6  Group Focus: abuse issues, acceptance, activities of daily living skills, anxiety, check in, chemical dependency issues, clarity of thought, communication, coping skills, daily focus, depression, family, feeling awareness/expression, forgiveness, goals/reality orientation, healthy friendships, and substance abuse education Treatment Modality:  Leisure Development Interventions utilized were group exercise, leisure development, patient education, story telling, and support Purpose: enhance coping skills, express feelings, increase insight, regain self-worth, reinforce self-care, and relapse prevention strategies  Name: Ronald Leach Date of Birth: 2001-05-24  MR: [id number]    Level of Participation: Pt was present but did not engage. Quality of Participation: cooperative, isolative, and quiet Interactions with others: gave feedback Mood/Affect: appropriate, bored, and tearful Triggers (if applicable): NA Cognition: coherent/clear Progress: None Response: pt. Did not engage Plan: patient will be encouraged to attend groups.  Patients Problems:  Patient Active Problem List   Diagnosis Date Noted   Methamphetamine abuse (HCC) 02/02/2024   Substance abuse (HCC) 02/02/2024   Bipolar I disorder, most recent episode (or current) manic (HCC) 08/04/2023   Depression 08/01/2023   Bipolar affective disorder, depressed, severe (HCC) 07/31/2023   Methamphetamine dependence (HCC) 07/31/2023

## 2024-02-02 NOTE — ED Notes (Addendum)
 Attempted to call report @ [phone] no answer

## 2024-02-02 NOTE — ED Notes (Signed)
 Pt is in the dayroom watching TV with peers. Pt denies SI/HI/AVH. Pt has no further complain.No acute distress noted. Will continue to monitor for safety and provide support.

## 2024-02-02 NOTE — ED Notes (Signed)
 Patient is sleeping. Respirations equal and unlabored, skin warm and dry. No change in assessment or acuity. Routine safety checks conducted according to facility protocol. Will continue to monitor for safety.

## 2024-02-03 DIAGNOSIS — Z91148 Patient's other noncompliance with medication regimen for other reason: Secondary | ICD-10-CM | POA: Diagnosis not present

## 2024-02-03 DIAGNOSIS — F319 Bipolar disorder, unspecified: Secondary | ICD-10-CM | POA: Diagnosis not present

## 2024-02-03 DIAGNOSIS — F1910 Other psychoactive substance abuse, uncomplicated: Secondary | ICD-10-CM | POA: Diagnosis not present

## 2024-02-03 DIAGNOSIS — Z79899 Other long term (current) drug therapy: Secondary | ICD-10-CM | POA: Diagnosis not present

## 2024-02-03 MED ORDER — ARIPIPRAZOLE 10 MG PO TABS
10.0000 mg | ORAL_TABLET | Freq: Every day | ORAL | Status: DC
  Administered 2024-02-03 – 2024-02-09 (×7): 10 mg via ORAL
  Filled 2024-02-03 (×7): qty 1

## 2024-02-03 MED ORDER — DIVALPROEX SODIUM 500 MG PO DR TAB
750.0000 mg | DELAYED_RELEASE_TABLET | Freq: Two times a day (BID) | ORAL | Status: DC
  Administered 2024-02-03 – 2024-02-04 (×2): 750 mg via ORAL
  Filled 2024-02-03 (×2): qty 1

## 2024-02-03 MED ORDER — NICOTINE POLACRILEX 2 MG MT GUM: 2.0000 mg | CHEWING_GUM | OROMUCOSAL | Status: DC | PRN

## 2024-02-03 MED ORDER — EMTRICITABINE-TENOFOVIR AF 200-25 MG PO TABS
1.0000 | ORAL_TABLET | Freq: Every day | ORAL | Status: DC
  Administered 2024-02-03 – 2024-02-09 (×7): 1 via ORAL
  Filled 2024-02-03 (×7): qty 1

## 2024-02-03 NOTE — ED Notes (Signed)
Pt in bed at this hour. No apparent distress. RR even and unlabored. Monitored for safety.

## 2024-02-03 NOTE — Discharge Instructions (Addendum)
 Outpatient Resources  [facility] [address] [place], [place] [address] [phone] Patient reports being seen by MD Alcario Drought for medication management. Patient reports he is working with a Sports coach who is seeking therapy services for him.   [facility] Address: [address] [place], [place] [address] Phone: [phone]  [facility] [address], [place], [address] [phone] phone  New Patient Assessment/Therapy Walk-Ins:  Monday and Wednesday: 8 am until slots are full. Every 1st and 2nd Fridays of the month: 1 pm - 5 pm.  NO ASSESSMENT/THERAPY WALK-INS ON TUESDAYS OR THURSDAYS  New Patient Assessment/Medication Management Walk-Ins:  Monday - Friday:  8 am - 11 am.  For all walk-ins, we ask that you arrive by 7:30 am because patients will be seen in the order of arrival.  Availability is limited; therefore, you may not be seen on the same day that you walk-in.  Our goal is to serve and meet the needs of our community to the best of our [place] ability.  SUBSTANCE USE TREATMENT for Medicaid and State Funded/IPRS  [facility] [address] [place], [place], [address] [phone] phone NOTE: [facility] is no longer offering IOP services.  Serves those who are low-income or have no insurance.  [facility] [address], [place], [place], [address] [phone] phone [phone] fax NOTE: Does have Substance Abuse-Intensive Outpatient Program [facility]) as well as transitional housing if eligible.  [facility] [address] [place], [place], [address] [phone] phone [phone] fax  [facility] [phone] [address] [place], [place], [address] [phone] phone [phone] fax  HALFWAY HOUSES:  [facility] [phone]  [employer].oxfordvacancies.com  12 STEP PROGRAMS:  Alcoholics Anonymous  of [employer] Software[employer]alet.be  Narcotics Anonymous of [place] [employer]dk  Al-Anon of [place], [place] www.[place].org/find-meetings.html  Nar-Anon https://nar-anon.org/find-a-meetin  List of Residential placements:   [facility] in [place]: [phone]  [facility]: [phone]  [place], [place] [phone]: Male and male facility; 30-day program: (uninsured and Medicaid such as Laurena Bering, [place], [place], partners)  [facility]: [phone]; men and women's facility; 28 days; Can have Medicaid tailored plan Tour manager or Partners)  [facility]: [phone] Karoline Caldwell or Larita Fife; 28 day program; must be fully detox; tailored Medicaid or no insurance  [address] in [place], [place]; [phone]; 28 day all males program; no insurance accepted  [facility] Referral in [place]: Gabriel Rung [phone] (no insurance or Medicaid only); 90 days; outpatient services but provide housing in apartments [place]  [facility] Admission: [phone]: Patient must complete phone screening for placement: [place], [place]; 6 month program; uninsured, Medicaid, and [employer].   [facility]: no insurance required; [phone]  [facility]: [phone]; Intake: Molly Maduro; Must fill out application online; Alecia Lemming Delay [phone] x [address] [facility] in [place], [place]: [phone]; Admissions Coordinators Mr. Maurine Minister or Barron Alvine; 90 day program.  [facility]: [place], [place] [phone]; Co-Ed 9 month to a year program; Online application; Men entry fee is $500 (6-67months);  [employer]: [address] [place], [place] [address]; no fee or insurance required; minimum of 2 years; Highly structured; work based; Intake Coordinator is Thayer Ohm [phone]  [facility] in [place], [place]: [phone];  Fax number is [phone]; website: www.[facility].org; Requires 3-6 page autobiography; 2 year program (18 months and then 51month transitional housing); Admission fee is $  300; no insurance needed; work Automotive engineer in [place], [place]: [employer] Desk Staff: Danise Edge [phone]: They have a Men's Regenerations Program 6-10months. Free program; There is an initial $300 fee however, they are willing to work with patients regarding that. Application is online.  [facility]: Admissions [phone] Doran Heater ext [phone]; Any 7-90 day program is out of pocket; 12 month program is free of charge; there is a $275 entry fee; Patient is responsible for own transportation
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
## 2024-02-03 NOTE — ED Notes (Signed)
 Patient was provided dinner

## 2024-02-03 NOTE — ED Notes (Signed)
 Patient A&Ox4. Denies intent to harm self/others when asked. Denies A/VH. Patient denies any physical complaints when asked. No acute distress noted. Support and encouragement provided. Routine safety checks conducted according to facility protocol. Encouraged patient to notify staff if thoughts of harm toward self or others arise. Patient verbalize understanding and agreement. Will continue to monitor for safety.

## 2024-02-03 NOTE — Group Note (Signed)
 Group Topic: Decisional Balance/Substance Abuse  Group Date: 02/03/2024 Start Time: 2000 End Time: 2100 Facilitators: Lauro Demetria Iwai, NT  Department: [facility]  Number of Participants: 7  Group Focus: community group and substance abuse education Treatment Modality:  Cognitive Behavioral Therapy Interventions utilized were leisure development, patient education, and support Purpose: enhance coping skills, express feelings, and increase insight  Name: Ronald Leach Date of Birth: [birth year]  MR: [id number]    Level of Participation: active Quality of Participation: attentive and cooperative Interactions with others: gave feedback Mood/Affect: appropriate Triggers (if applicable): N/A Cognition: coherent/clear Progress: Gaining insight Response: N/A Plan: patient will be encouraged to keep attending groups.  Patients Problems:  Patient Active Problem List   Diagnosis Date Noted   Methamphetamine abuse (HCC) 02/02/2024   Substance abuse (HCC) 02/02/2024   Bipolar I disorder, most recent episode (or current) manic (HCC) 08/04/2023   Depression 08/01/2023   Bipolar affective disorder, depressed, severe (HCC) 07/31/2023   Methamphetamine dependence (HCC) 07/31/2023

## 2024-02-03 NOTE — Group Note (Signed)
 Group Topic: Substance Abuse Treatment  Group Date: 02/03/2024 Start Time: 0200 End Time: 0300 Facilitators: Loleta Dicker, LCSW  Department: [facility]  Number of Participants: 6  Group Focus: chemical dependency education and chemical dependency issues Treatment Modality:  Cognitive Behavioral Therapy Interventions utilized were problem solving and support Purpose: enhance coping skills, relapse prevention strategies, and trigger / craving management  Name: Ronald Leach Date of Birth: Sep 20, 2001  MR: [id number]    Level of Participation: active Quality of Participation: attentive and cooperative Response: LCSW group was scheduled to be at this time, however AA representative presented for group. Patient participated in AA meeting on today and there were no issues to report.  Plan: referral / recommendations  Patients Problems:  Patient Active Problem List   Diagnosis Date Noted   Methamphetamine abuse (HCC) 02/02/2024   Substance abuse (HCC) 02/02/2024   Bipolar I disorder, most recent episode (or current) manic (HCC) 08/04/2023   Depression 08/01/2023   Bipolar affective disorder, depressed, severe (HCC) 07/31/2023   Methamphetamine dependence (HCC) 07/31/2023
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
## 2024-02-03 NOTE — Group Note (Signed)
 Group Topic: Healthy Self Image and Positive Change  Group Date: 02/03/2024 Start Time: 1050 End Time: 1200 Facilitators: Cassandria Anger  Department: [facility]  Number of Participants: 6  Group Focus: goals/reality orientation Treatment Modality:  Psychoeducation Interventions utilized were patient education Purpose: increase insight  Name: Ronald Leach Date of Birth: Oct 16, 2001  MR: [id number]    Level of Participation: when cued Quality of Participation: attentive, cooperative, and quiet Interactions with others: gave feedback Mood/Affect: appropriate and positive Triggers (if applicable): N/A Cognition: coherent/clear, goal directed, and insightful Progress: Gaining insight Response: Patient was asked to set goals. Patient stated that his long term goal is to get into college for social work. Possibly starting a peer support group. Patient stated that to assist him in accomplishing that goals would be seeking and growing close to his higher power Plan: patient will be encouraged to continue to attend group  Patients Problems:  Patient Active Problem List   Diagnosis Date Noted   Methamphetamine abuse (HCC) 02/02/2024   Substance abuse (HCC) 02/02/2024   Bipolar I disorder, most recent episode (or current) manic (HCC) 08/04/2023   Depression 08/01/2023   Bipolar affective disorder, depressed, severe (HCC) 07/31/2023   Methamphetamine dependence (HCC) 07/31/2023

## 2024-02-03 NOTE — ED Notes (Signed)
 Pt sitting in group room actively participating in AA group. No acute distress noted. Environment secured. Will continue to monitor for safety.

## 2024-02-03 NOTE — ED Notes (Signed)
 Pt sleeping in no acute distress. RR even and unlabored. Environment secured. Will continue to monitor for safety.

## 2024-02-03 NOTE — ED Notes (Signed)
Pt in dayroom at this hour. No apparent distress. RR even and unlabored. Monitored for safety.  

## 2024-02-03 NOTE — ED Notes (Signed)
 Patient is sleeping. Respirations equal and unlabored, skin warm and dry. No change in assessment or acuity. Routine safety checks conducted according to facility protocol. Will continue to monitor for safety.

## 2024-02-04 DIAGNOSIS — F1910 Other psychoactive substance abuse, uncomplicated: Secondary | ICD-10-CM | POA: Diagnosis not present

## 2024-02-04 DIAGNOSIS — Z91148 Patient's other noncompliance with medication regimen for other reason: Secondary | ICD-10-CM | POA: Diagnosis not present

## 2024-02-04 DIAGNOSIS — Z79899 Other long term (current) drug therapy: Secondary | ICD-10-CM | POA: Diagnosis not present

## 2024-02-04 DIAGNOSIS — F319 Bipolar disorder, unspecified: Secondary | ICD-10-CM | POA: Diagnosis not present

## 2024-02-04 LAB — VALPROIC ACID LEVEL: Valproic Acid Lvl: 62 ug/mL (ref 50.0–100.0)

## 2024-02-04 MED ORDER — NICOTINE 21 MG/24HR TD PT24
21.0000 mg | MEDICATED_PATCH | Freq: Every day | TRANSDERMAL | Status: DC
Start: 2024-02-04 — End: 2024-02-09
  Administered 2024-02-04 – 2024-02-08 (×5): 21 mg via TRANSDERMAL
  Filled 2024-02-04 (×6): qty 1

## 2024-02-04 MED ORDER — HYDROXYZINE HCL 25 MG PO TABS
25.0000 mg | ORAL_TABLET | Freq: Three times a day (TID) | ORAL | Status: DC
  Administered 2024-02-04 – 2024-02-09 (×15): 25 mg via ORAL
  Filled 2024-02-04 (×15): qty 1

## 2024-02-04 MED ORDER — DIVALPROEX SODIUM 500 MG PO DR TAB
500.0000 mg | DELAYED_RELEASE_TABLET | Freq: Two times a day (BID) | ORAL | Status: DC
  Administered 2024-02-04 – 2024-02-09 (×10): 500 mg via ORAL
  Filled 2024-02-04 (×10): qty 1

## 2024-02-04 NOTE — ED Notes (Signed)
Patient observed resting quietly, eyes closed. Respirations equal and unlabored. Will continue to monitor for safety.  

## 2024-02-04 NOTE — Group Note (Signed)
 Group Topic: Recovery Basics  Group Date: 02/04/2024 Start Time: 2000 End Time: 2057 Facilitators: Rae Lips B  Department: [facility]  Number of Participants: 4  Group Focus: check in, community group, coping skills, daily focus, and depression Treatment Modality:  Individual Therapy Interventions utilized were leisure development, story telling, and support Purpose: express feelings, express irrational fears, and relapse prevention strategies  Name: FRITZ CAUTHON Date of Birth: 09/16/2001  MR: [id number]    Level of Participation: active Quality of Participation: attentive and cooperative Interactions with others: gave feedback Mood/Affect: appropriate, brightens with interaction, and positive Triggers (if applicable): NA Cognition: coherent/clear Progress: Gaining insight Response: He has support at home with his boyfriend that he will be going back to on Saturday he said. His bf is sober and will help him with his recovery.  Plan: patient will be encouraged to keep going to groups.   Patients Problems:  Patient Active Problem List   Diagnosis Date Noted   Methamphetamine abuse (HCC) 02/02/2024   Substance abuse (HCC) 02/02/2024   Bipolar I disorder, most recent episode (or current) manic (HCC) 08/04/2023   Depression 08/01/2023   Bipolar affective disorder, depressed, severe (HCC) 07/31/2023   Methamphetamine dependence (HCC) 07/31/2023

## 2024-02-04 NOTE — ED Notes (Signed)
 Pt is in the dayroom composed and pleasant watching TV with other patients. Denies SI/HI/AVH. NAD Will monitor for safety.

## 2024-02-04 NOTE — ED Notes (Signed)
Pt in bed at this hour. No apparent distress. RR even and unlabored. Monitored for safety.

## 2024-02-04 NOTE — ED Notes (Signed)
 Pt is in the bedroom composed and sleeping.NAD, Environment secured per facility policy. Respirations are even and unlabored. Will continue to monitor for safety.

## 2024-02-04 NOTE — ED Notes (Signed)
 Patient A&Ox4. Has been calm, cooperative, and appropriate with peers. He denies intent to harm self/others. Denies A/VH. Patient denies any physical pain or discomfort. No acute distress observed. Routine safety checks conducted according to facility protocol. Patient agreed to notify staff should thoughts of harm toward self or others arise. We will continue to monitor for safety.
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
## 2024-02-04 NOTE — Group Note (Signed)
 Group Topic: Balance in Life  Group Date: 02/04/2024 Start Time: 1105 End Time: 1215 Facilitators: Vicki Mallet, NT  Department: [facility]  Number of Participants: 9  Group Focus: check in and diet Treatment Modality:  Psychoeducation Interventions utilized were patient education Purpose: reinforce self-care  Name: Ronald Leach Date of Birth: 07/01/2001  MR: [id number]    Level of Participation: active Quality of Participation: attentive Interactions with others: gave feedback Mood/Affect: appropriate Triggers (if applicable): none Cognition: coherent/clear Progress: Moderate Response: Pt shared during check in. Pt shared during the discussion of diet that [name] is a good prebiotic.  Plan: follow-up needed  Patients Problems:  Patient Active Problem List   Diagnosis Date Noted   Methamphetamine abuse (HCC) 02/02/2024   Substance abuse (HCC) 02/02/2024   Bipolar I disorder, most recent episode (or current) manic (HCC) 08/04/2023   Depression 08/01/2023   Bipolar affective disorder, depressed, severe (HCC) 07/31/2023   Methamphetamine dependence (HCC) 07/31/2023

## 2024-02-04 NOTE — Group Note (Signed)
 Group Topic: Spirituality in Recovery  Group Date: 02/04/2024 Start Time: 1238 End Time: 1418 Facilitators: Loleta Dicker, LCSW  Department: [facility]  Number of Participants: 7  Group Focus: affirmation, check in, clarity of thought, communication, daily focus, feeling awareness/expression, personal responsibility, problem solving, relapse prevention, safety plan, and self-awareness Treatment Modality:  Behavior Modification Therapy, Cognitive Behavioral Therapy, and Spiritual Interventions utilized were story telling and support Purpose: enhance coping skills, explore maladaptive thinking, express feelings, express irrational fears, improve communication skills, increase insight, regain self-worth, reinforce self-care, relapse prevention strategies, and trigger / craving management  Name: Ronald Leach Date of Birth: 2001/07/31  MR: [id number]    Level of Participation: active Quality of Participation: attentive and cooperative Interactions with others: gave feedback Mood/Affect: appropriate Triggers (if applicable): N/A Cognition: coherent/clear and goal directed Progress: Gaining insight Description of Group:  This group will address the importance of considering the journey and not just the destination. Patients will be encouraged to process areas in their lives where they found it hard to focus on the good rather than the bad, and identify reasons for maintaining such thought pattern. Facilitator will guide patients utilizing problem- solving interventions to address and improve the way each patient views themselves and their situation. Patients will work through understanding and applying humility when it comes to life changes and the interactions we have with others. Patients will be encouraged to explore ways that they will move forward throughout life's journey, and make healthier decisions for themselves.   Therapeutic Goals: Patient will identify  two or more emotions or situations that they observed or felt throughout watching the video clip. Patient will identify signs where they may have responded to someone or situation due to their current circumstance.  Patient will identify two ways to set better habits in order to achieve more peace in their lives. Patient will demonstrate ability to communicate their needs through discussion and/or role plays.  Response: Patient actively participated in group on today. Patient reports that the video clip definitely made him reflect on his life decisions and how he wants to make better choice moving forward.  Patient reports feeling encouraged from watching the video, and his was able to provide feedback to staff and peers.  Plan: referral / recommendations Patients Problems:  Patient Active Problem List   Diagnosis Date Noted   Methamphetamine abuse (HCC) 02/02/2024   Substance abuse (HCC) 02/02/2024   Bipolar I disorder, most recent episode (or current) manic (HCC) 08/04/2023   Depression 08/01/2023   Bipolar affective disorder, depressed, severe (HCC) 07/31/2023   Methamphetamine dependence (HCC) 07/31/2023

## 2024-02-05 ENCOUNTER — Other Ambulatory Visit: Payer: Self-pay

## 2024-02-05 DIAGNOSIS — F319 Bipolar disorder, unspecified: Secondary | ICD-10-CM | POA: Diagnosis not present

## 2024-02-05 DIAGNOSIS — Z91148 Patient's other noncompliance with medication regimen for other reason: Secondary | ICD-10-CM | POA: Diagnosis not present

## 2024-02-05 DIAGNOSIS — Z79899 Other long term (current) drug therapy: Secondary | ICD-10-CM | POA: Diagnosis not present

## 2024-02-05 DIAGNOSIS — F1910 Other psychoactive substance abuse, uncomplicated: Secondary | ICD-10-CM | POA: Diagnosis not present

## 2024-02-05 NOTE — ED Notes (Signed)
 Pt resting in bed now. Ate dinner earlier. No s/sx of distress. No concerns at this time.

## 2024-02-05 NOTE — Discharge Planning (Signed)
 Patient spoke with LCSW regarding his plans at discharge.  Patient reports his plan is to return back home with his partner Theron Arista once he is stable for discharge.  Patient asked if LCSW for follow-up with partner to safety plan.  Contact information was provided and the LCSW contacted the partner to discuss plans for disposition. Patient reports he would still like for his outpatient services arranged in [place] even though his partner resides in [place], [place]. Patient reports he only wants to be connected with [facility] for outpatient services in [place].  LCSW contacted patient's partner. Per partner, he does not have a place for the patient to return to at this time. Partner reports he is making phone calls to friends and has been in contact with the patient's family regarding additional support.  Partner reports he would like for the patient to be discharged to his care and reports no safety concerns. However, he still needs to work out logistics regarding where they was will reside once he is discharged. Partner asked if he could follow up with LCSW at a later time on today once he has confirmed plans. LCSW confirmed. No other needs to report at this time. LCSW will continue to follow and provide support to patient and family while on [facility] unit.    Fernande Boyden, LCSW Clinical Social Worker [facility] Ph: [phone]

## 2024-02-05 NOTE — ED Notes (Signed)
 Pt awake & eating lunch now. No concerns, no s/sx of distress.

## 2024-02-05 NOTE — Group Note (Signed)
 Group Topic: Balance in Life  Group Date: 02/05/2024 Start Time: 0215 End Time: 0310 Facilitators: Loleta Dicker, LCSW  Department: [facility]  Number of Participants: 5  Group Focus: activities of daily living skills, check in, coping skills, daily focus, problem solving, self-awareness, and self-esteem Treatment Modality:  Patient-Centered Therapy Interventions utilized were exploration, story telling, and support Purpose: express feelings, express irrational fears, improve communication skills, increase insight, regain self-worth, reinforce self-care, relapse prevention strategies, and trigger / craving management  Name: Ronald Leach Date of Birth: [birth year]  MR: [id number]    Type of Therapy:  Group Therapy  Participation Level:  Active  Participation Quality:  Appropriate  Affect:  Appropriate  Cognitive:  Appropriate  Insight:  Developing/Improving  Engagement in Therapy:  Developing/Improving  Modes of Intervention:  Activity, Discussion, Rapport Building, Socialization and Support  Summary of Progress/Problems: Patient actively participated in group on today. Group started off with introductions and group rules. Group members participated in a therapeutic activity that required active listening and communication skills. Group members were able to identify similarities and differences within the group. Patient interacted positively with staff and peers. No issues to report.   Patients Problems:  Patient Active Problem List   Diagnosis Date Noted   Methamphetamine abuse (HCC) 02/02/2024   Substance abuse (HCC) 02/02/2024   Bipolar I disorder, most recent episode (or current) manic (HCC) 08/04/2023   Depression 08/01/2023   Bipolar affective disorder, depressed, severe (HCC) 07/31/2023   Methamphetamine dependence (HCC) 07/31/2023

## 2024-02-05 NOTE — ED Notes (Signed)
 Pt is in the bedroom composed and sleeping.NAD. Respirations even and unlabored. Will continue to monitor for safety

## 2024-02-05 NOTE — Group Note (Signed)
 Group Topic: Positive Affirmations  Group Date: 02/05/2024 Start Time: 1600 End Time: 1630 Facilitators: Jerald Kief, RN  Department: [facility]  Number of Participants: 6  Group Focus: communication and coping skills Treatment Modality:  Psychoeducation Interventions utilized were group exercise and patient education Purpose: enhance coping skills, improve communication skills, and relapse prevention strategies  Name: Ronald Leach Date of Birth: 2001-07-29  MR: [id number]    Level of Participation: active Quality of Participation: cooperative Interactions with others: gave feedback Mood/Affect: appropriate and brightens with interaction Triggers (if applicable): n/a Cognition: coherent/clear and concrete Progress: Moderate Response: Pt states that his main coping strategies are staying away from negative people and getting adequate sleep. He lists his support system as: his dog, his family, and primarily, his partner. He is very motivated and wanting help. Has bene speaking with his partner throughout the day, who provides calming support for him.  Plan: follow-up needed: pt is seeking outpatient tx but has upcoming court dates. Healthcare team will have to follow up on this.   Patients Problems:  Patient Active Problem List   Diagnosis Date Noted   Methamphetamine abuse (HCC) 02/02/2024   Substance abuse (HCC) 02/02/2024   Bipolar I disorder, most recent episode (or current) manic (HCC) 08/04/2023   Depression 08/01/2023   Bipolar affective disorder, depressed, severe (HCC) 07/31/2023   Methamphetamine dependence (HCC) 07/31/2023

## 2024-02-05 NOTE — ED Provider Notes (Signed)
 Behavioral Health Progress Note  Date and Time: 02/05/2024 2:24 PM Name: Ronald Leach MRN:  [id number] Diagnosis:  Final diagnoses:  Polysubstance abuse (HCC)  Bipolar I disorder, most recent episode (or current) manic [facility])    Reason for admission:  Ronald Leach is a 23 y.o. male  with a past psychiatric history of stimulant use disorder (methamphetamine), bipolar 1 disorder, and a psychiatric hospitalization at [facility] in September 2024. Patient initially arrived to [facility] on 02/01/2024 under IVC for erratic behaviors in the setting of ongoing methamphetamine use and noncompliance on psychotropic medications to manage his manic episodes, and admitted to [facility]  on 02/03/2024 for substance related issues and intensive therapeutic interventions. UDS on admission is positive for amphetamines and THC.   Subjective:   Patient evaluated at bedside this morning.  Reports adequate sleep and appetite.  Denies suicidal ideations, homicidal ideations, and AVH.  He reports no cravings or withdrawal symptoms.  Patient endorses medication adherence with no reported side effects.  Was informed that trough Depakote level would be obtained Sunday morning.  Regarding safety planning, patient reports he will discharge with his partner Theron Arista and he provided verbal consent for me to contact him for collateral.  --Was informed by staff later in the afternoon, the patient had now agreed to explore residential rehabilitation options.  Was informed by LCSW that due to his upcoming court dates in April 4 and April 19, this may be a barrier to placement.  Denies: SI/HI/AVH  3/28:  --Attempted to contact patient's father, Carlyle Mcelrath, at [phone].  Left HIPAA compliant voicemail.  --Spoke with Golda Acre, patient's significant other, at [phone]: Theron Arista reports that he currently does not have a home, and has been staying at a friend's home.  Theron Arista has been working on applying for housing.  He is unable  to provide the patient with housing at the moment, and the both of them would be staying at the friend's house if the patient declines residential rehabilitation.  Theron Arista expresses concern about patient's mental health, believes his manic episodes have led to risky behaviors.  Theron Arista shares that the patient has gotten involved with a bad group of people and suspects the patient may have got involved in sex work.  He would like the patient to go through with residential rehabilitation at discharge.  Noe Gens concern the patient does not fully grasp the severity of his diagnoses and the importance for continued treatment.   Substance Use Hx: Tobacco: vapes daily, reports 1 cartidge will last him 2 weeks. Patient has been vaping since age 46.  Cannabis: Uses every other day, uses 0.5 gram per use -- uses it for anxiety  Cocaine: Denies Methamphetamines: 2-3 month hx of use, route smoking, no IVDU.  Psilocybin (mushrooms): Denies Ecstasy (MDMA / molly): Denies LSD (acid): never tried: Denies Opiates (fentanyl / heroin): Denies Benzos (Xanax, Klonopin): Denies IVDU: Denies Detox hx: No Rehab hx: No  Past Psychiatric Hx: Seen by [facility] in [place] [facility] Previous Psychiatric Diagnoses: bipolar I disorder, stimulant use disorder Current psychiatric medications: depakote, abilify, hydroxyzyne -- Psychiatric Hospitalization hx: The patient has been previously hospitalized for psychiatric and substance use treatment on at least three occasions, most recently at [facility] from 08/01/23 to 08/06/23    Past Medical History: PCP: has f/u with [facility] Medical Dx: Denies Medications: Denies Allergies: Denies Surgeries: denies Trauma: Denies Seizures:Denies  Family Medical History: Reports 2 aunts with diabetes  Family Psychiatric History: Psychiatric Dx: Maternal aunt -  bipolar  Suicide AY:TKZSWF Violence/Aggression:Denies Substance UXN:ATFTDD  Social  History: Living Situation: Social Support: Father, aunt, partner Education: completed some college Occupational hx: unemployed isnce August, quit [employer] due to mental health Marital Status: Single Children: Denies Legal: Has traffic court on April 4th and 19th Military: Denies Access to firearms: Denies   Total Time spent with patient: 1 hour   Current Medications:  Current Facility-Administered Medications  Medication Dose Route Frequency Provider Last Rate Last Admin   acetaminophen (TYLENOL) tablet 650 mg  650 mg Oral Q6H PRN Sindy Guadeloupe, NP       alum & mag hydroxide-simeth (MAALOX/MYLANTA) 200-200-20 MG/5ML suspension 30 mL  30 mL Oral Q4H PRN Sindy Guadeloupe, NP       ARIPiprazole (ABILIFY) tablet 10 mg  10 mg Oral Daily Myriam Forehand, NP   10 mg at 02/05/24 1045   dicyclomine (BENTYL) tablet 20 mg  20 mg Oral Q6H PRN Sindy Guadeloupe, NP       divalproex (DEPAKOTE) DR tablet 500 mg  500 mg Oral Q12H Carrion-Carrero, Zareena Willis, MD   500 mg at 02/05/24 1045   emtricitabine-tenofovir AF (DESCOVY) 200-25 MG per tablet 1 tablet  1 tablet Oral Daily Myriam Forehand, NP   1 tablet at 02/05/24 1045   hydrOXYzine (ATARAX) tablet 25 mg  25 mg Oral TID Lorri Frederick, MD   25 mg at 02/05/24 1045   loperamide (IMODIUM) capsule 2-4 mg  2-4 mg Oral PRN Sindy Guadeloupe, NP       magnesium hydroxide (MILK OF MAGNESIA) suspension 30 mL  30 mL Oral Daily PRN Sindy Guadeloupe, NP       methocarbamol (ROBAXIN) tablet 500 mg  500 mg Oral Q8H PRN Sindy Guadeloupe, NP       nicotine (NICODERM CQ - dosed in mg/24 hours) patch 21 mg  21 mg Transdermal Daily Carrion-Carrero, Verena Shawgo, MD   21 mg at 02/05/24 1045   nicotine polacrilex (NICORETTE) gum 2 mg  2 mg Oral PRN Myriam Forehand, NP       OLANZapine (ZYPREXA) injection 10 mg  10 mg Intramuscular TID PRN Sindy Guadeloupe, NP       OLANZapine (ZYPREXA) injection 5 mg  5 mg Intramuscular TID PRN Sindy Guadeloupe, NP       OLANZapine zydis (ZYPREXA) disintegrating  tablet 5 mg  5 mg Oral TID PRN Sindy Guadeloupe, NP       ondansetron (ZOFRAN-ODT) disintegrating tablet 4 mg  4 mg Oral Q6H PRN Sindy Guadeloupe, NP       Current Outpatient Medications  Medication Sig Dispense Refill   ARIPiprazole (ABILIFY) 10 MG tablet Take 1 tablet (10 mg total) by mouth daily. 30 tablet 0   divalproex (DEPAKOTE ER) 500 MG 24 hr tablet Take 1,500 mg by mouth daily.     docusate sodium (COLACE) 100 MG capsule Take 1 capsule (100 mg total) by mouth 2 (two) times daily. (Patient not taking: Reported on 02/02/2024)     hydrOXYzine (ATARAX) 25 MG tablet Take 25 mg by mouth 3 (three) times daily.     nicotine polacrilex (NICORETTE) 2 MG gum Take 1 each (2 mg total) by mouth as needed for smoking cessation. (Patient not taking: Reported on 02/02/2024) 40 tablet 0    Labs  Lab Results:  Admission on 02/02/2024  Component Date Value Ref Range Status   Valproic Acid Lvl 02/04/2024 62  50.0 - 100.0 ug/mL Final   Performed at [facility], [address], [place], [place] [address]  Admission on 02/01/2024, Discharged on 02/02/2024  Component Date Value Ref Range Status   Sodium 02/01/2024 140  135 - 145 mmol/L Final   Potassium 02/01/2024 4.0  3.5 - 5.1 mmol/L Final   Chloride 02/01/2024 102  98 - 111 mmol/L Final   CO2 02/01/2024 28  22 - 32 mmol/L Final   Glucose, Bld 02/01/2024 92  70 - 99 mg/dL Final   Glucose reference range applies only to samples taken after fasting for at least 8 hours.   BUN 02/01/2024 8  6 - 20 mg/dL Final   Creatinine, Ser 02/01/2024 0.83  0.61 - 1.24 mg/dL Final   Calcium 78/29/5621 9.4  8.9 - 10.3 mg/dL Final   Total Protein 30/86/5784 7.0  6.5 - 8.1 g/dL Final   Albumin 69/62/9528 4.3  3.5 - 5.0 g/dL Final   AST 41/32/4401 16  15 - 41 U/L Final   ALT 02/01/2024 12  0 - 44 U/L Final   Alkaline Phosphatase 02/01/2024 70  38 - 126 U/L Final   Total Bilirubin 02/01/2024 0.7  0.0 - 1.2 mg/dL Final   GFR, Estimated 02/01/2024 >60  >60 mL/min Final    Comment: (NOTE) Calculated using the CKD-EPI Creatinine Equation (2021)    Anion gap 02/01/2024 10  5 - 15 Final   Performed at [facility], [address] [place], [place] [address]   Alcohol, Ethyl (B) 02/01/2024 <10  <10 mg/dL Final   Comment: (NOTE) Lowest detectable limit for serum alcohol is 10 mg/dL.  For medical purposes only. Performed at [facility], [address] [place], [place] [address]    Salicylate Lvl 02/01/2024 <7.0 (L)  7.0 - 30.0 mg/dL Final   Performed at [facility], [address] [place], [place] [address]   Acetaminophen (Tylenol), Serum 02/01/2024 <10 (L)  10 - 30 ug/mL Final   Comment: (NOTE) Therapeutic concentrations vary significantly. A range of 10-30 ug/mL  may be an effective concentration for many patients. However, some  are best treated at concentrations outside of this range. Acetaminophen concentrations >150 ug/mL at 4 hours after ingestion  and >50 ug/mL at 12 hours after ingestion are often associated with  toxic reactions.  Performed at [facility], [address] [place], [place] [address]    WBC 02/01/2024 13.0 (H)  4.0 - 10.5 K/uL Final   RBC 02/01/2024 4.98  4.22 - 5.81 MIL/uL Final   Hemoglobin 02/01/2024 15.7  13.0 - 17.0 g/dL Final   HCT 38/75/6433 47.3  39.0 - 52.0 % Final   MCV 02/01/2024 95.0  80.0 - 100.0 fL Final   MCH 02/01/2024 31.5  26.0 - 34.0 pg Final   MCHC 02/01/2024 33.2  30.0 - 36.0 g/dL Final   RDW 29/51/8841 12.6  11.5 - 15.5 % Final   Platelets 02/01/2024 337  150 - 400 K/uL Final   nRBC 02/01/2024 0.0  0.0 - 0.2 % Final   Performed at [facility], [address] [place], [place] [address]   Tricyclic, Ur Screen 02/01/2024 NONE DETECTED  NONE DETECTED Final   Amphetamines, Ur Screen 02/01/2024 POSITIVE (A)  NONE DETECTED Final   Comment: (NOTE) Trazodone is metabolized in vivo to several metabolites, including pharmacologically active m-CPP, which  is excreted in the urine. Immunoassay screens for amphetamines and MDMA have potential cross-reactivity with these compounds and may provide false positive  results.     MDMA (Ecstasy)Ur Screen 02/01/2024 NONE DETECTED  NONE DETECTED Final   Cocaine Metabolite,Ur [place] 02/01/2024 NONE DETECTED  NONE DETECTED Final   Opiate, Ur Screen 02/01/2024 NONE DETECTED  NONE DETECTED Final   Phencyclidine (PCP) Ur S 02/01/2024 NONE DETECTED  NONE DETECTED Final   Cannabinoid 50 Ng, Ur [place] 02/01/2024 POSITIVE (A)  NONE DETECTED Final   Barbiturates, Ur Screen 02/01/2024 NONE DETECTED  NONE DETECTED Final   Benzodiazepine, Ur Scrn 02/01/2024 NONE DETECTED  NONE DETECTED Final   Methadone Scn, Ur 02/01/2024 NONE DETECTED  NONE DETECTED Final   Comment: (NOTE) Tricyclics + metabolites, urine    Cutoff 1000 ng/mL Amphetamines + metabolites, urine  Cutoff 1000 ng/mL MDMA (Ecstasy), urine              Cutoff 500 ng/mL Cocaine Metabolite, urine          Cutoff 300 ng/mL Opiate + metabolites, urine        Cutoff 300 ng/mL Phencyclidine (PCP), urine         Cutoff 25 ng/mL Cannabinoid, urine                 Cutoff 50 ng/mL Barbiturates + metabolites, urine  Cutoff 200 ng/mL Benzodiazepine, urine              Cutoff 200 ng/mL Methadone, urine                   Cutoff 300 ng/mL  The urine drug screen provides only a preliminary, unconfirmed analytical test result and should not be used for non-medical purposes. Clinical consideration and professional judgment should be applied to any positive drug screen result due to possible interfering substances. A more specific alternate chemical method must be used in order to obtain a confirmed analytical result. Gas chromatography / mass spectrometry (GC/MS) is the preferred confirm                          atory method. Performed at [facility], [address] [place], [place] [address]     Blood Alcohol level:  Lab Results  Component Value Date    [facility] <10 02/01/2024   ETH <10 07/31/2023    Metabolic Disorder Labs: Lab Results  Component Value Date   HGBA1C <4.2 (L) 08/02/2023   MPG <74 08/02/2023   No results found for: "PROLACTIN" Lab Results  Component Value Date   CHOL 199 08/04/2023   TRIG 93 08/04/2023   HDL 52 08/04/2023   CHOLHDL 3.8 08/04/2023   VLDL 19 08/04/2023   LDLCALC 128 (H) 08/04/2023    Therapeutic Lab Levels: No results found for: "LITHIUM" Lab Results  Component Value Date   VALPROATE 62 02/04/2024   No results found for: "CBMZ"  Physical Findings   AIMS    Flowsheet Row [facility]  AIMS Total Score 0      AUDIT    Flowsheet Row ED from 02/02/2024 in [facility] Admission (Discharged[facility]  Alcohol Use Disorder Identification Test Final Score (AUDIT) 0 0      PHQ2-9    Flowsheet Row ED from 02/02/2024 in [facility]  PHQ-2 Total Score 1  PHQ-9 Total Score 5      Flowsheet Row ED from 02/02/2024 in [facility] from 02[facility]01/2024 in [facility] Admission (Discharged) [facility]  [facility]  C-SSRS RISK CATEGORY No Risk No Risk No Risk        Musculoskeletal  Strength & Muscle Tone: within normal limits Gait & Station: normal Patient leans: N/A  Psychiatric Specialty Exam  Presentation  General Appearance:  Appropriate for Environment  Eye Contact: Good  Speech: Clear and Coherent; Normal Rate  Speech Volume: Normal  Handedness: Not assessed   Mood and Affect  Mood: Euthymic  Affect: Congruent; Full Range   Thought Process  Thought Processes: Linear  Descriptions of Associations:Intact  Orientation:None  Thought Content:Logical  Diagnosis of  Schizophrenia or Schizoaffective disorder in past: No    Hallucinations:Hallucinations: None   Ideas of Reference:None  Suicidal Thoughts:Suicidal Thoughts: No   Homicidal Thoughts:Homicidal Thoughts: No    Sensorium  Memory: Immediate Good; Recent Good; Remote Good  Judgment: Fair  Insight: Fair   Art therapist  Concentration: Good  Attention Span: Good  Recall: Good  Fund of Knowledge: Good  Language: Good   Psychomotor Activity  Psychomotor Activity: Psychomotor Activity: Normal    Assets  Assets: Desire for Improvement; Resilience; Communication Skills   Sleep  Sleep: Sleep: Good     Physical Exam  Physical Exam Vitals and nursing note reviewed.  Constitutional:      General: He is not in acute distress.    Appearance: He is not ill-appearing.  HENT:     Head: Normocephalic and atraumatic.  Pulmonary:     Effort: Pulmonary effort is normal. No respiratory distress.  Skin:    General: Skin is warm and dry.  Neurological:     General: No focal deficit present.    Review of Systems  All other systems reviewed and are negative.  Blood pressure 113/70, pulse 86, temperature 98 F (36.7 C), temperature source Oral, resp. rate 16, SpO2 100%. There is no height or weight on file to calculate BMI.  Treatment Plan Summary: Daily contact with patient to assess and evaluate symptoms and progress in treatment and Medication management  Status: under IVC, upheld, 2nd exam submitted on 02/04/2024   Psychiatric Diagnoses and Treatment:  Bipolar 1 disorder Continue home Abilify 10 mg daily Continue Depakote to 500 mg Q12Hr, which is home dose per dispense hx Plan to obtain VPA level on Sunday 3/30 morning  Tobacco use disorder Smoking cessation encouraged Nicotine patch 21 mg daily   Medical Issues Being Addressed:  DECLINED STI SCREENING  HIV Continue home preexposure prophylaxis   Other PRNs: acetaminophen, 650 mg,  Q6H PRN alum & mag hydroxide-simeth, 30 mL, Q4H PRN dicyclomine, 20 mg, Q6H PRN loperamide, 2-4 mg, PRN magnesium hydroxide, 30 mL, Daily PRN methocarbamol, 500 mg, Q8H PRN nicotine polacrilex, 2 mg, PRN OLANZapine, 10 mg, TID PRN OLANZapine, 5 mg, TID PRN OLANZapine zydis, 5 mg, TID PRN ondansetron, 4 mg, Q6H PRN   --Other Labs/Imaging Reviewed  EKG on 02/02/2025: QTc 449   Discharge Planning:   -- Social work and case management to assist with discharge planning and identification of hospital follow-up needs prior to discharge  -- Disposition: Patient is reconsidering residential rehabilitation, barrier to placement may be his upcoming court dates.  Will plan to discuss alternative of CD IOP with patient.  -- Discharge Concerns: Need to establish a safety plan; Medication compliance and effectiveness  -- Discharge Goals: ;outpatient referrals for mental health follow-up including medication management/psychotherapy    Signed: Lorri Frederick, MD 02/05/2024 2:24 PM

## 2024-02-05 NOTE — ED Notes (Signed)
Pt in dayroom at this hour. No apparent distress. RR even and unlabored. Monitored for safety.  

## 2024-02-05 NOTE — ED Notes (Signed)
 Progress note   D: Pt seen in dayroom. Pt denies SI, HI, AVH. Pt rates pain  0/10. Pt rates anxiety  2/10 and depression  0/10. Pt denies any withdrawal symptoms. Minimal in interaction but is attending groups and cooperative with treatment plan. No other concerns noted at this time.  A: Pt provided support and encouragement. Pt given scheduled medication as prescribed. PRNs as appropriate. Q15 min checks for safety.   R: Pt safe on the unit. Will continue to monitor.

## 2024-02-05 NOTE — ED Notes (Signed)
 Pt resting in room, resp even and unlabored. No s/sx of distress. No concerns at this time.

## 2024-02-05 NOTE — ED Notes (Signed)
 COWS 2 d/t HR, but states he is feeling fine. Not requesting any meds at this time.

## 2024-02-05 NOTE — Group Note (Signed)
 Group Topic: Healthy Self Image and Positive Change  Group Date: 02/05/2024 Start Time: 2000 End Time: 2045 Facilitators: Lauro Garvey [place], NT  Department: [facility]  Number of Participants: 5  Group Focus: affirmation, clarity of thought, and communication Treatment Modality:  Cognitive Behavioral Therapy Interventions utilized were confrontation, leisure development, and support Purpose: explore maladaptive thinking, express feelings, regain self-worth, and exploring positive replacement thoughts, through affirmations.  Name: Ronald Leach Date of Birth: 2001/04/18  MR: [id number]    Level of Participation: active Quality of Participation: attentive and cooperative Interactions with others: interactive with peers and gave feedback Mood/Affect: appropriate Triggers (if applicable): N/A Cognition: coherent/clear Progress: Gaining insight Response: N/A Plan: patient will be encouraged to keep attending group.  Patients Problems:  Patient Active Problem List   Diagnosis Date Noted   Methamphetamine abuse (HCC) 02/02/2024   Substance abuse (HCC) 02/02/2024   Bipolar I disorder, most recent episode (or current) manic (HCC) 08/04/2023   Depression 08/01/2023   Bipolar affective disorder, depressed, severe (HCC) 07/31/2023   Methamphetamine dependence (HCC) 07/31/2023

## 2024-02-05 NOTE — Group Note (Signed)
 Group Topic: Understanding Self  Group Date: 02/05/2024 Start Time: 1600 End Time: 1700 Facilitators: Elenor Quinones, NT  Department: [facility]  Number of Participants: 7  Group Focus: self-awareness Treatment Modality:  Psychoeducation Interventions utilized were patient education Purpose: increase insight  Name: Ronald Leach Date of Birth: 05-23-2001  MR: [id number]    Level of Participation: active Quality of Participation: attentive Interactions with others: gave feedback Mood/Affect: appropriate Triggers (if applicable): n/a Cognition: concrete Progress: Moderate Response: pt is aware that he struggles with following directions Plan: follow-up needed  Patients Problems:  Patient Active Problem List   Diagnosis Date Noted   Methamphetamine abuse (HCC) 02/02/2024   Substance abuse (HCC) 02/02/2024   Bipolar I disorder, most recent episode (or current) manic (HCC) 08/04/2023   Depression 08/01/2023   Bipolar affective disorder, depressed, severe (HCC) 07/31/2023   Methamphetamine dependence (HCC) 07/31/2023

## 2024-02-06 DIAGNOSIS — Z79899 Other long term (current) drug therapy: Secondary | ICD-10-CM | POA: Diagnosis not present

## 2024-02-06 DIAGNOSIS — F319 Bipolar disorder, unspecified: Secondary | ICD-10-CM | POA: Diagnosis not present

## 2024-02-06 DIAGNOSIS — Z91148 Patient's other noncompliance with medication regimen for other reason: Secondary | ICD-10-CM | POA: Diagnosis not present

## 2024-02-06 DIAGNOSIS — F1910 Other psychoactive substance abuse, uncomplicated: Secondary | ICD-10-CM | POA: Diagnosis not present

## 2024-02-06 NOTE — Group Note (Signed)
 Group Topic: Relapse and Recovery  Group Date: 02/06/2024 Start Time: 2000 End Time: 2100 Facilitators: Guss Bunde  Department: [facility]  Number of Participants: 6  Group Focus: chemical dependency education Treatment Modality:   Interventions utilized were support Purpose: relapse prevention strategies  Name: Ronald Leach Date of Birth: Jul 04, 2001  MR: [id number]    Level of Participation: active Quality of Participation: attentive Interactions with others: gave feedback Mood/Affect: appropriate Triggers (if applicable):  Cognition: goal directed Progress: Gaining insight Response: AA Facilitator Plan: patient will be encouraged to follow up with goals  Patients Problems:  Patient Active Problem List   Diagnosis Date Noted   Methamphetamine abuse (HCC) 02/02/2024   Substance abuse (HCC) 02/02/2024   Bipolar I disorder, most recent episode (or current) manic (HCC) 08/04/2023   Depression 08/01/2023   Bipolar affective disorder, depressed, severe (HCC) 07/31/2023   Methamphetamine dependence (HCC) 07/31/2023

## 2024-02-06 NOTE — ED Notes (Signed)
 Patient in the dayroom calm and composed watching TV with other patients. Denies SI/HI/AVH. Respirations even and unlabored. Will monitor for safety.

## 2024-02-06 NOTE — Group Note (Signed)
 Group Topic: Communication  Group Date: 02/06/2024 Start Time: 0900 End Time: 0945 Facilitators: Prentice Docker, RN  Department: [facility]  Number of Participants: 7  Group Focus: feeling awareness/expression Treatment Modality:  Individual Therapy Interventions utilized were other medication education Purpose: increase insight  Name: Ronald Leach Date of Birth: [birth year]  MR: [id number]    Level of Participation: active Quality of Participation: cooperative Interactions with others: gave feedback Mood/Affect: appropriate Triggers (if applicable): none identified Cognition: insightful Progress: Gaining insight Response: Pt verbalized understanding and indication of all medication given Plan: patient will be encouraged to notify staff with any needs or concerns pertaining to medication administered  Patients Problems:  Patient Active Problem List   Diagnosis Date Noted   Methamphetamine abuse (HCC) 02/02/2024   Substance abuse (HCC) 02/02/2024   Bipolar I disorder, most recent episode (or current) manic (HCC) 08/04/2023   Depression 08/01/2023   Bipolar affective disorder, depressed, severe (HCC) 07/31/2023   Methamphetamine dependence (HCC) 07/31/2023

## 2024-02-06 NOTE — Group Note (Signed)
 Group Topic: Balance in Life  Group Date: 02/06/2024 Start Time: 1215 End Time: 1315 Facilitators: Vonzell Schlatter B  Department: [facility]  Number of Participants: 5  Group Focus: affirmation and daily focus Treatment Modality:  Psychoeducation Interventions utilized were patient education and support Purpose: increase insight and reinforce self-care  Name: Ronald Leach Date of Birth: 16-Aug-2001  MR: [id number]    Level of Participation: active Quality of Participation: attentive and cooperative Interactions with others: gave feedback Mood/Affect: positive Triggers (if applicable): N/A Cognition: coherent/clear Progress: Moderate Response: Pt is wanting help and plan to go inpatient at a facility and he wants to go back to school for Mental Health Plan: follow-up needed  Patients Problems:  Patient Active Problem List   Diagnosis Date Noted   Methamphetamine abuse (HCC) 02/02/2024   Substance abuse (HCC) 02/02/2024   Bipolar I disorder, most recent episode (or current) manic (HCC) 08/04/2023   Depression 08/01/2023   Bipolar affective disorder, depressed, severe (HCC) 07/31/2023   Methamphetamine dependence (HCC) 07/31/2023

## 2024-02-06 NOTE — ED Provider Notes (Signed)
 Behavioral Health Progress Note  Date and Time: 02/06/2024 11:55 AM Name: Ronald Leach MRN:  [id number]  Subjective:   Ronald Leach is a 23 y.o. male  with a past psychiatric history of stimulant use disorder (methamphetamine), bipolar 1 disorder, and a psychiatric hospitalization at [facility] in September 2024. Patient initially arrived to [facility] on 02/01/2024 under IVC for erratic behaviors in the setting of ongoing methamphetamine use and noncompliance on psychotropic medications to manage his manic episodes, and admitted to [facility]  on 02/03/2024 for substance related issues and intensive therapeutic interventions. UDS on admission is positive for amphetamines and THC.    He reports that he is doing okay today.  He reports no withdrawal symptoms.  He reports no cravings.  He reports that he is still interested in going to residential rehab for further treatment.  Discussed with him that we would need to wait until Monday for further updates on that due to facilities accepting of the weekend and he reported understanding.  Discussed with him that we would be drawn blood work tomorrow to check his Depakote level and he was agreeable with this.  Discussed with him that we were unable to contact his father yesterday and so would try again today and he was agreeable with this.  He reports no SI, HI, or AVH.  He reports sleep is good.  He reports appetite is doing good.  He reports no side effects to his medications.  He reports no other concerns at present.   Attempted to contact patient's father, Ronald Leach, [phone].  However there was no answer.   Diagnosis:  Final diagnoses:  Polysubstance abuse (HCC)  Bipolar I disorder, most recent episode (or current) manic (HCC)    Total Time spent with patient:  I personally spent 35 minutes on the unit in direct patient care. The direct patient care time included face-to-face time with the patient, reviewing the patient's chart, communicating  with other professionals, and coordinating care. Greater than 50% of this time was spent in counseling or coordinating care with the patient regarding goals of hospitalization, psycho-education, and discharge planning needs.   Substance Use History: Tobacco: vapes daily, reports 1 cartidge will last him 2 weeks. Patient has been vaping since age 79.  Cannabis: Uses every other day, uses 0.5 gram per use -- uses it for anxiety  Cocaine: Denies Methamphetamines: 2-3 month hx of use, route smoking, no IVDU.  Psilocybin (mushrooms): Denies Ecstasy (MDMA / molly): Denies LSD (acid): never tried: Denies Opiates (fentanyl / heroin): Denies Benzos (Xanax, Klonopin): Denies IVDU: Denies Detox hx: No Rehab hx: No  Past Psychiatric History:  Seen by [facility] in [place] ([employer]) Previous Psychiatric Diagnoses: bipolar I disorder, stimulant use disorder Current psychiatric medications: depakote, abilify, hydroxyzyne -- Psychiatric Hospitalization hx: The patient has been previously hospitalized for psychiatric and substance use treatment on at least three occasions, most recently at [facility] from 08/01/23 to 08/06/23   Past Medical History:  PCP: has f/u with [facility] Medical Dx: Denies Medications: Denies Allergies: Denies Surgeries: denies Trauma: Denies Seizures:Denies  Family History:  Reports 2 aunts with diabetes   Family Psychiatric  History:  Psychiatric Dx: Maternal aunt - bipolar  Suicide KG:MWNUUV Violence/Aggression:Denies Substance OZD:GUYQIH  Social History:  Living Situation: Social Support: Father, aunt, partner Education: completed some college Occupational hx: unemployed isnce August, quit [employer] due to mental health Marital Status: Single Children: Denies Legal: Has traffic court on April 4th and 19th Military: Denies Access  to firearms: Denies   Additional Social History:                         Sleep:  Good  Appetite:  Good  Current Medications:  Current Facility-Administered Medications  Medication Dose Route Frequency Provider Last Rate Last Admin   acetaminophen (TYLENOL) tablet 650 mg  650 mg Oral Q6H PRN Sindy Guadeloupe, NP       alum & mag hydroxide-simeth (MAALOX/MYLANTA) 200-200-20 MG/5ML suspension 30 mL  30 mL Oral Q4H PRN Sindy Guadeloupe, NP       ARIPiprazole (ABILIFY) tablet 10 mg  10 mg Oral Daily Myriam Forehand, NP   10 mg at 02/06/24 0935   dicyclomine (BENTYL) tablet 20 mg  20 mg Oral Q6H PRN Sindy Guadeloupe, NP       divalproex (DEPAKOTE) DR tablet 500 mg  500 mg Oral Q12H Carrion-Carrero, Karle Starch, MD   500 mg at 02/06/24 0935   emtricitabine-tenofovir AF (DESCOVY) 200-25 MG per tablet 1 tablet  1 tablet Oral Daily Myriam Forehand, NP   1 tablet at 02/06/24 0935   hydrOXYzine (ATARAX) tablet 25 mg  25 mg Oral TID Lorri Frederick, MD   25 mg at 02/06/24 0935   loperamide (IMODIUM) capsule 2-4 mg  2-4 mg Oral PRN Sindy Guadeloupe, NP       magnesium hydroxide (MILK OF MAGNESIA) suspension 30 mL  30 mL Oral Daily PRN Sindy Guadeloupe, NP       methocarbamol (ROBAXIN) tablet 500 mg  500 mg Oral Q8H PRN Sindy Guadeloupe, NP       nicotine (NICODERM CQ - dosed in mg/24 hours) patch 21 mg  21 mg Transdermal Daily Carrion-Carrero, Karle Starch, MD   21 mg at 02/06/24 0934   nicotine polacrilex (NICORETTE) gum 2 mg  2 mg Oral PRN Myriam Forehand, NP       OLANZapine (ZYPREXA) injection 10 mg  10 mg Intramuscular TID PRN Sindy Guadeloupe, NP       OLANZapine (ZYPREXA) injection 5 mg  5 mg Intramuscular TID PRN Sindy Guadeloupe, NP       OLANZapine zydis (ZYPREXA) disintegrating tablet 5 mg  5 mg Oral TID PRN Sindy Guadeloupe, NP       ondansetron (ZOFRAN-ODT) disintegrating tablet 4 mg  4 mg Oral Q6H PRN Sindy Guadeloupe, NP       Current Outpatient Medications  Medication Sig Dispense Refill   ARIPiprazole (ABILIFY) 10 MG tablet Take 1 tablet (10 mg total) by mouth daily. 30 tablet 0   divalproex (DEPAKOTE  ER) 500 MG 24 hr tablet Take 1,500 mg by mouth daily.     docusate sodium (COLACE) 100 MG capsule Take 1 capsule (100 mg total) by mouth 2 (two) times daily. (Patient not taking: Reported on 02/02/2024)     hydrOXYzine (ATARAX) 25 MG tablet Take 25 mg by mouth 3 (three) times daily.     nicotine polacrilex (NICORETTE) 2 MG gum Take 1 each (2 mg total) by mouth as needed for smoking cessation. (Patient not taking: Reported on 02/02/2024) 40 tablet 0    Labs  Lab Results:  Admission on 02/02/2024  Component Date Value Ref Range Status   Valproic Acid Lvl 02/04/2024 62  50.0 - 100.0 ug/mL Final   Performed at [facility], [address] [place], [place] [address]  Admission on 02/01/2024, Discharged on 02/02/2024  Component Date Value Ref Range Status   Sodium 02/01/2024 140  135 - 145 mmol/L Final   Potassium 02/01/2024 4.0  3.5 - 5.1 mmol/L Final   Chloride 02/01/2024 102  98 - 111 mmol/L Final   CO2 02/01/2024 28  22 - 32 mmol/L Final   Glucose, Bld 02/01/2024 92  70 - 99 mg/dL Final   Glucose reference range applies only to samples taken after fasting for at least 8 hours.   BUN 02/01/2024 8  6 - 20 mg/dL Final   Creatinine, Ser 02/01/2024 0.83  0.61 - 1.24 mg/dL Final   Calcium 84/13/2440 9.4  8.9 - 10.3 mg/dL Final   Total Protein 09/06/2535 7.0  6.5 - 8.1 g/dL Final   Albumin 64/40/3474 4.3  3.5 - 5.0 g/dL Final   AST 25/95/6387 16  15 - 41 U/L Final   ALT 02/01/2024 12  0 - 44 U/L Final   Alkaline Phosphatase 02/01/2024 70  38 - 126 U/L Final   Total Bilirubin 02/01/2024 0.7  0.0 - 1.2 mg/dL Final   GFR, Estimated 02/01/2024 >60  >60 mL/min Final   Comment: (NOTE) Calculated using the CKD-EPI Creatinine Equation (2021)    Anion gap 02/01/2024 10  5 - 15 Final   Performed at [facility], [address] [place], [place] [address]   Alcohol, Ethyl (B) 02/01/2024 <10  <10 mg/dL Final   Comment: (NOTE) Lowest detectable limit for serum alcohol is 10  mg/dL.  For medical purposes only. Performed at [facility], [address] [place], [place] [address]    Salicylate Lvl 02/01/2024 <7.0 (L)  7.0 - 30.0 mg/dL Final   Performed at [facility], [address] [place], [place] [address]   Acetaminophen (Tylenol), Serum 02/01/2024 <10 (L)  10 - 30 ug/mL Final   Comment: (NOTE) Therapeutic concentrations vary significantly. A range of 10-30 ug/mL  may be an effective concentration for many patients. However, some  are best treated at concentrations outside of this range. Acetaminophen concentrations >150 ug/mL at 4 hours after ingestion  and >50 ug/mL at 12 hours after ingestion are often associated with  toxic reactions.  Performed at [facility], [address] [place], [place] [address]    WBC 02/01/2024 13.0 (H)  4.0 - 10.5 K/uL Final   RBC 02/01/2024 4.98  4.22 - 5.81 MIL/uL Final   Hemoglobin 02/01/2024 15.7  13.0 - 17.0 g/dL Final   HCT 60/08/9322 47.3  39.0 - 52.0 % Final   MCV 02/01/2024 95.0  80.0 - 100.0 fL Final   MCH 02/01/2024 31.5  26.0 - 34.0 pg Final   MCHC 02/01/2024 33.2  30.0 - 36.0 g/dL Final   RDW 55/73/2202 12.6  11.5 - 15.5 % Final   Platelets 02/01/2024 337  150 - 400 K/uL Final   nRBC 02/01/2024 0.0  0.0 - 0.2 % Final   Performed at [facility], [address] [place], [place] [address]   Tricyclic, Ur Screen 02/01/2024 NONE DETECTED  NONE DETECTED Final   Amphetamines, Ur Screen 02/01/2024 POSITIVE (A)  NONE DETECTED Final   Comment: (NOTE) Trazodone is metabolized in vivo to several metabolites, including pharmacologically active m-CPP, which is excreted in the urine. Immunoassay screens for amphetamines and MDMA have potential cross-reactivity with these compounds and may provide false positive  results.     MDMA (Ecstasy)Ur Screen 02/01/2024 NONE DETECTED  NONE DETECTED Final   Cocaine Metabolite,Ur Index 02/01/2024 NONE DETECTED  NONE DETECTED Final    Opiate, Ur Screen 02/01/2024 NONE DETECTED  NONE DETECTED Final   Phencyclidine (PCP) Ur S 02/01/2024 NONE DETECTED  NONE DETECTED Final   Cannabinoid 50 Ng, Ur [place] 02/01/2024 POSITIVE (A)  NONE DETECTED Final   Barbiturates, Ur Screen 02/01/2024 NONE DETECTED  NONE DETECTED Final   Benzodiazepine, Ur Scrn 02/01/2024 NONE DETECTED  NONE DETECTED Final   Methadone Scn, Ur 02/01/2024 NONE DETECTED  NONE DETECTED Final   Comment: (NOTE) Tricyclics + metabolites, urine    Cutoff 1000 ng/mL Amphetamines + metabolites, urine  Cutoff 1000 ng/mL MDMA (Ecstasy), urine              Cutoff 500 ng/mL Cocaine Metabolite, urine          Cutoff 300 ng/mL Opiate + metabolites, urine        Cutoff 300 ng/mL Phencyclidine (PCP), urine         Cutoff 25 ng/mL Cannabinoid, urine                 Cutoff 50 ng/mL Barbiturates + metabolites, urine  Cutoff 200 ng/mL Benzodiazepine, urine              Cutoff 200 ng/mL Methadone, urine                   Cutoff 300 ng/mL  The urine drug screen provides only a preliminary, unconfirmed analytical test result and should not be used for non-medical purposes. Clinical consideration and professional judgment should be applied to any positive drug screen result due to possible interfering substances. A more specific alternate chemical method must be used in order to obtain a confirmed analytical result. Gas chromatography / mass spectrometry (GC/MS) is the preferred confirm                          atory method. Performed at [facility], [address] [place], [place] [address]     Blood Alcohol level:  Lab Results  Component Value Date   [facility] <10 02/01/2024   ETH <10 07/31/2023    Metabolic Disorder Labs: Lab Results  Component Value Date   HGBA1C <4.2 (L) 08/02/2023   MPG <74 08/02/2023   No results found for: "PROLACTIN" Lab Results  Component Value Date   CHOL 199 08/04/2023   TRIG 93 08/04/2023   HDL 52 08/04/2023   CHOLHDL 3.8  08/04/2023   VLDL 19 08/04/2023   LDLCALC 128 (H) 08/04/2023    Therapeutic Lab Levels: No results found for: "LITHIUM" Lab Results  Component Value Date   VALPROATE 62 02/04/2024   No results found for: "CBMZ"  Physical Findings   AIMS    Flowsheet Row [facility]  AIMS Total Score 0      AUDIT    Flowsheet Row ED from 02/02/2024 in [facility] Admission (Discharged[facility]  Alcohol Use Disorder Identification Test Final Score (AUDIT) 0 0      PHQ2-9    Flowsheet Row ED from 02/02/2024 in [facility]  PHQ-2 Total Score 1  PHQ-9 Total Score 6      Flowsheet Row ED from 02/02/2024 in [facility] ED from 02/01/2024 in [facility] Admission (Discharged) [facility]  C-SSRS RISK CATEGORY No Risk No Risk No Risk  Musculoskeletal  Strength & Muscle Tone: within normal limits Gait & Station: normal Patient leans: N/A  Psychiatric Specialty Exam  Presentation  General Appearance:  Appropriate for Environment; Casual  Eye Contact: Good  Speech: Clear and Coherent; Normal Rate  Speech Volume: Normal  Handedness: -- (not assessed)   Mood and Affect  Mood: Euthymic  Affect: Appropriate; Congruent   Thought Process  Thought Processes: Coherent; Linear  Descriptions of Associations:Intact  Orientation:Full (Time, Place and Person)  Thought Content:WDL; Logical  Diagnosis of Schizophrenia or Schizoaffective disorder in past: No    Hallucinations:Hallucinations: None  Ideas of Reference:None  Suicidal Thoughts:Suicidal Thoughts: No  Homicidal Thoughts:Homicidal Thoughts: No   Sensorium  Memory: Immediate  Good  Judgment: Fair  Insight: Fair   Executive Functions  Concentration: Good  Attention Span: Good  Recall: Good  Fund of Knowledge: Good  Language: Good   Psychomotor Activity  Psychomotor Activity: Psychomotor Activity: Normal   Assets  Assets: Communication Skills; Desire for Improvement; Physical Health; Resilience   Sleep  Sleep: Sleep: Good   Nutritional Assessment (For OBS and [facility]BC admissions only) Has the patient had a weight loss or gain of 10 pounds or more in the last 3 months?: No Has the patient had a decrease in food intake/or appetite?: No Does the patient have dental problems?: No Does the patient have eating habits or behaviors that may be indicators of an eating disorder including binging or inducing vomiting?: No Has the patient recently lost weight without trying?: 0 Has the patient been eating poorly because of a decreased appetite?: 0 Malnutrition Screening Tool Score: 0    Physical Exam  Physical Exam Vitals and nursing note reviewed.  Constitutional:      General: He is not in acute distress.    Appearance: Normal appearance. He is normal weight. He is not ill-appearing or toxic-appearing.  HENT:     Head: Normocephalic and atraumatic.  Pulmonary:     Effort: Pulmonary effort is normal.  Musculoskeletal:        General: Normal range of motion.  Neurological:     General: No focal deficit present.     Mental Status: He is alert.    Review of Systems  Respiratory:  Negative for cough and shortness of breath.   Cardiovascular:  Negative for chest pain.  Gastrointestinal:  Negative for abdominal pain, constipation, diarrhea, nausea and vomiting.  Neurological:  Negative for dizziness, weakness and headaches.  Psychiatric/Behavioral:  Negative for depression, hallucinations and suicidal ideas. The patient is not nervous/anxious.    Blood pressure 124/75, pulse 99, temperature 98.3 F (36.8 C), temperature source Oral,  resp. rate 19, SpO2 100%. There is no height or weight on file to calculate BMI.  Treatment Plan Summary: Daily contact with patient to assess and evaluate symptoms and progress in treatment and Medication management   Ronald Leach is a 23 y.o. male  with a past psychiatric history of stimulant use disorder (methamphetamine), bipolar 1 disorder, and a psychiatric hospitalization at [facility] in September 2024. Patient initially arrived to [facility] on 02/01/2024 under IVC for erratic behaviors in the setting of ongoing methamphetamine use and noncompliance on psychotropic medications to manage his manic episodes, and admitted to [facility]  on 02/03/2024 for substance related issues and intensive therapeutic interventions. UDS on admission is positive for amphetamines and THC.    Ronald Leach is tolerating detox well.  He continues to want residential rehab and appreciate social work's assistance with this.  He does have upcoming  court dates and so we will continue to attempt to contact his father to get these dates moved.  We will not make any changes to his medication at this time.  We will continue to monitor.    Status: under IVC, upheld, 2nd exam submitted on 02/04/2024     Psychiatric Diagnoses and Treatment:  Bipolar 1 disorder Continue Abilify 10 mg daily for mood stability Continue Depakote 500 mg q12  Plan to obtain VPA level on Sunday 3/30 morning -Continue Agitation Protocol: Zyprexa   Withdrawal: -Continue COWS, last score=2  @ 1834  3/28 -Continue Bentyl 20 mg q6 PRN spasms -Continue Imodium 2-4 mg PRN diarrhea -Continue Robaxin 500 mg q8 PRN muscle spasms -Continue Naproxen 500 mg BID PRN pain -Continue Zofran-ODT 4 mg q6 PRN nausea   Nicotine Dependence: -Continue Nicotine Patch 21 mg daily -Continue Nicotine gum 2 mg PRN    -Continue PRN's: Tylenol, Maalox, Atarax, Milk of Magnesia    Medical Issues Being Addressed:  DECLINED STI SCREENING   HIV Continue Descovy 200-25 mg  daily       --Other Labs/Imaging Reviewed   EKG on 02/02/2025: QTc 449      Discharge Planning:              -- Social work and case management to assist with discharge planning and identification of hospital follow-up needs prior to discharge             -- Disposition: Patient is reconsidering residential rehabilitation, barrier to placement may be his upcoming court dates.  Will plan to discuss alternative of CD IOP with patient.             -- Discharge Concerns: Need to establish a safety plan; Medication compliance and effectiveness             -- Discharge Goals: ;outpatient referrals for mental health follow-up including medication management/psychotherapy     Lauro Franklin, MD 02/06/2024 11:55 AM

## 2024-02-06 NOTE — ED Notes (Signed)
 Patient A&Ox4. Denies intent to harm self/others when asked. Denies A/VH. Patient denies any physical complaints when asked. No acute distress noted. Support and encouragement provided. Routine safety checks conducted according to facility protocol. Encouraged patient to notify staff if thoughts of harm toward self or others arise. Patient verbalize understanding and agreement. Will continue to monitor for safety.

## 2024-02-06 NOTE — ED Notes (Signed)
Pt asleep at this hour. No apparent distress. RR even and unlabored. Monitored for safety.  

## 2024-02-06 NOTE — ED Notes (Signed)
 Patient in the bedroom calm and sleeping.NAD, Respirations even and unlabored. Will monitor closely for safety.

## 2024-02-06 NOTE — ED Notes (Signed)
 Pt sitting in dayroom watching television and interacting with peers. No acute distress noted. No concerns voiced. Informed pt to notify staff with any needs or assistance. Pt verbalized understanding and agreement. Will continue to monitor for safety.

## 2024-02-07 DIAGNOSIS — F1910 Other psychoactive substance abuse, uncomplicated: Secondary | ICD-10-CM | POA: Diagnosis not present

## 2024-02-07 DIAGNOSIS — Z79899 Other long term (current) drug therapy: Secondary | ICD-10-CM | POA: Diagnosis not present

## 2024-02-07 DIAGNOSIS — Z91148 Patient's other noncompliance with medication regimen for other reason: Secondary | ICD-10-CM | POA: Diagnosis not present

## 2024-02-07 DIAGNOSIS — F319 Bipolar disorder, unspecified: Secondary | ICD-10-CM | POA: Diagnosis not present

## 2024-02-07 LAB — COMPREHENSIVE METABOLIC PANEL WITH GFR
ALT: 10 U/L (ref 0–44)
AST: 12 U/L — ABNORMAL LOW (ref 15–41)
Albumin: 3.7 g/dL (ref 3.5–5.0)
Alkaline Phosphatase: 60 U/L (ref 38–126)
Anion gap: 8 (ref 5–15)
BUN: 9 mg/dL (ref 6–20)
CO2: 29 mmol/L (ref 22–32)
Calcium: 9.3 mg/dL (ref 8.9–10.3)
Chloride: 102 mmol/L (ref 98–111)
Creatinine, Ser: 0.74 mg/dL (ref 0.61–1.24)
GFR, Estimated: >60 mL/min (ref >60)
Glucose, Bld: 98 mg/dL (ref 70–99)
Potassium: 4 mmol/L (ref 3.5–5.1)
Sodium: 139 mmol/L (ref 135–145)
Total Bilirubin: 0.4 mg/dL (ref 0.0–1.2)
Total Protein: 6.4 g/dL — ABNORMAL LOW (ref 6.5–8.1)

## 2024-02-07 LAB — VALPROIC ACID LEVEL: Valproic Acid Lvl: 99 ug/mL (ref 50.0–100.0)

## 2024-02-07 NOTE — ED Provider Notes (Signed)
 Behavioral Health Progress Note  Date and Time: 02/07/2024 12:45 PM Name: Ronald Leach MRN:  [id number]  Subjective:   Ronald Leach is a 23 y.o. male  with a past psychiatric history of stimulant use disorder (methamphetamine), bipolar 1 disorder, and a psychiatric hospitalization at [facility] in September 2024. Patient initially arrived to [facility] on 02/01/2024 under IVC for erratic behaviors in the setting of ongoing methamphetamine use and noncompliance on psychotropic medications to manage his manic episodes, and admitted to [facility]  on 02/03/2024 for substance related issues and intensive therapeutic interventions. UDS on admission is positive for amphetamines and THC.    He reports he is doing good today.  He reports no withdrawal symptoms.  He reports no cravings.  Discussed with him that his Depakote level came back within the target range and that his CMP also showed no issues.  Discussed with him that we did attempt to contact his father but there was no answer.  Encouraged him to try reaching out to his father but that social work would be able to send documentation to the court house to request a change of date of his court dates.  He reports no SI, HI, or AVH.  He reports his sleep was good last night.  He reports appetite is good.  He reports no side effects to his medications.  He reports no other concerns at present.   Diagnosis:  Final diagnoses:  Polysubstance abuse (HCC)  Bipolar I disorder, most recent episode (or current) manic (HCC)    Total Time spent with patient:  I personally spent 35 minutes on the unit in direct patient care. The direct patient care time included face-to-face time with the patient, reviewing the patient's chart, communicating with other professionals, and coordinating care. Greater than 50% of this time was spent in counseling or coordinating care with the patient regarding goals of hospitalization, psycho-education, and discharge planning  needs.   Substance Use History: Tobacco: vapes daily, reports 1 cartidge will last him 2 weeks. Patient has been vaping since age 24.  Cannabis: Uses every other day, uses 0.5 gram per use -- uses it for anxiety  Cocaine: Denies Methamphetamines: 2-3 month hx of use, route smoking, no IVDU.  Psilocybin (mushrooms): Denies Ecstasy (MDMA / molly): Denies LSD (acid): never tried: Denies Opiates (fentanyl / heroin): Denies Benzos (Xanax, Klonopin): Denies IVDU: Denies Detox hx: No Rehab hx: No  Past Psychiatric History:  Seen by [facility] in [place] ([employer]) Previous Psychiatric Diagnoses: bipolar I disorder, stimulant use disorder Current psychiatric medications: depakote, abilify, hydroxyzyne -- Psychiatric Hospitalization hx: The patient has been previously hospitalized for psychiatric and substance use treatment on at least three occasions, most recently at [facility] from 08/01/23 to 08/06/23   Past Medical History:  PCP: has f/u with [facility] Medical Dx: Denies Medications: Denies Allergies: Denies Surgeries: denies Trauma: Denies Seizures:Denies  Family History:  Reports 2 aunts with diabetes   Family Psychiatric  History:  Psychiatric Dx: Maternal aunt - bipolar  Suicide WU:[id number] Violence/Aggression:Denies Substance YNW:[id number]  Social History:  Living Situation: Social Support: Father, aunt, partner Education: completed some college Occupational hx: unemployed isnce August, quit [employer] due to mental health Marital Status: Single Children: Denies Legal: Has traffic court on April 4th and 19th Military: Denies Access to firearms: Denies   Additional Social History:                         Sleep: Good  Appetite:  Good  Current Medications:  Current Facility-Administered Medications  Medication Dose Route Frequency Provider Last Rate Last Admin   acetaminophen (TYLENOL) tablet 650 mg  650 mg Oral Q6H PRN  Sindy Guadeloupe, NP       alum & mag hydroxide-simeth (MAALOX/MYLANTA) 200-200-20 MG/5ML suspension 30 mL  30 mL Oral Q4H PRN Sindy Guadeloupe, NP       ARIPiprazole (ABILIFY) tablet 10 mg  10 mg Oral Daily Myriam Forehand, NP   10 mg at 02/07/24 4696   dicyclomine (BENTYL) tablet 20 mg  20 mg Oral Q6H PRN Sindy Guadeloupe, NP       divalproex (DEPAKOTE) DR tablet 500 mg  500 mg Oral Q12H Carrion-Carrero, Karle Starch, MD   500 mg at 02/07/24 0959   emtricitabine-tenofovir AF (DESCOVY) 200-25 MG per tablet 1 tablet  1 tablet Oral Daily Myriam Forehand, NP   1 tablet at 02/07/24 2952   hydrOXYzine (ATARAX) tablet 25 mg  25 mg Oral TID Lorri Frederick, MD   25 mg at 02/07/24 8413   loperamide (IMODIUM) capsule 2-4 mg  2-4 mg Oral PRN Sindy Guadeloupe, NP       magnesium hydroxide (MILK OF MAGNESIA) suspension 30 mL  30 mL Oral Daily PRN Sindy Guadeloupe, NP       methocarbamol (ROBAXIN) tablet 500 mg  500 mg Oral Q8H PRN Sindy Guadeloupe, NP       nicotine (NICODERM CQ - dosed in mg/24 hours) patch 21 mg  21 mg Transdermal Daily Carrion-Carrero, Karle Starch, MD   21 mg at 02/07/24 1001   nicotine polacrilex (NICORETTE) gum 2 mg  2 mg Oral PRN Myriam Forehand, NP       OLANZapine (ZYPREXA) injection 10 mg  10 mg Intramuscular TID PRN Sindy Guadeloupe, NP       OLANZapine (ZYPREXA) injection 5 mg  5 mg Intramuscular TID PRN Sindy Guadeloupe, NP       OLANZapine zydis (ZYPREXA) disintegrating tablet 5 mg  5 mg Oral TID PRN Sindy Guadeloupe, NP       ondansetron (ZOFRAN-ODT) disintegrating tablet 4 mg  4 mg Oral Q6H PRN Sindy Guadeloupe, NP       Current Outpatient Medications  Medication Sig Dispense Refill   ARIPiprazole (ABILIFY) 10 MG tablet Take 1 tablet (10 mg total) by mouth daily. 30 tablet 0   divalproex (DEPAKOTE ER) 500 MG 24 hr tablet Take 1,500 mg by mouth daily.     docusate sodium (COLACE) 100 MG capsule Take 1 capsule (100 mg total) by mouth 2 (two) times daily. (Patient not taking: Reported on 02/02/2024)      hydrOXYzine (ATARAX) 25 MG tablet Take 25 mg by mouth 3 (three) times daily.     nicotine polacrilex (NICORETTE) 2 MG gum Take 1 each (2 mg total) by mouth as needed for smoking cessation. (Patient not taking: Reported on 02/02/2024) 40 tablet 0    Labs  Lab Results:  Admission on 02/02/2024  Component Date Value Ref Range Status   Valproic Acid Lvl 02/04/2024 62  50.0 - 100.0 ug/mL Final   Performed at [facility], [address], [place], [place] [address]   Valproic Acid Lvl 02/07/2024 99  50.0 - 100.0 ug/mL Final   Performed at [facility], [address] [place], [place] [address]   Sodium 02/07/2024 139  135 - 145 mmol/L Final   Potassium 02/07/2024 4.0  3.5 - 5.1 mmol/L Final   Chloride 02/07/2024 102  98 - 111 mmol/L Final   CO2 02/07/2024 29  22 - 32 mmol/L Final   Glucose, Bld 02/07/2024 98  70 - 99 mg/dL Final   Glucose reference range applies only to samples taken after fasting for at least 8 hours.   BUN 02/07/2024 9  6 - 20 mg/dL Final   Creatinine, Ser 02/07/2024 0.74  0.61 - 1.24 mg/dL Final   Calcium 16/08/9603 9.3  8.9 - 10.3 mg/dL Final   Total Protein 54/07/8118 6.4 (L)  6.5 - 8.1 g/dL Final   Albumin 14/78/2956 3.7  3.5 - 5.0 g/dL Final   AST 21/30/8657 12 (L)  15 - 41 U/L Final   ALT 02/07/2024 10  0 - 44 U/L Final   Alkaline Phosphatase 02/07/2024 60  38 - 126 U/L Final   Total Bilirubin 02/07/2024 0.4  0.0 - 1.2 mg/dL Final   GFR, Estimated 02/07/2024 >60  >60 mL/min Final   Comment: (NOTE) Calculated using the CKD-EPI Creatinine Equation (2021)    Anion gap 02/07/2024 8  5 - 15 Final   Performed at [facility], [address] [place], [place] [address]  Admission on 02/01/2024, Discharged on 02/02/2024  Component Date Value Ref Range Status   Sodium 02/01/2024 140  135 - 145 mmol/L Final   Potassium 02/01/2024 4.0  3.5 - 5.1 mmol/L Final   Chloride 02/01/2024 102  98 - 111 mmol/L Final   CO2 02/01/2024 28  22 - 32 mmol/L Final    Glucose, Bld 02/01/2024 92  70 - 99 mg/dL Final   Glucose reference range applies only to samples taken after fasting for at least 8 hours.   BUN 02/01/2024 8  6 - 20 mg/dL Final   Creatinine, Ser 02/01/2024 0.83  0.61 - 1.24 mg/dL Final   Calcium 29/52/8413 9.4  8.9 - 10.3 mg/dL Final   Total Protein 24/40/1027 7.0  6.5 - 8.1 g/dL Final   Albumin 25/36/6440 4.3  3.5 - 5.0 g/dL Final   AST 34/74/2595 16  15 - 41 U/L Final   ALT 02/01/2024 12  0 - 44 U/L Final   Alkaline Phosphatase 02/01/2024 70  38 - 126 U/L Final   Total Bilirubin 02/01/2024 0.7  0.0 - 1.2 mg/dL Final   GFR, Estimated 02/01/2024 >60  >60 mL/min Final   Comment: (NOTE) Calculated using the CKD-EPI Creatinine Equation (2021)    Anion gap 02/01/2024 10  5 - 15 Final   Performed at [facility], [address] [place], [place] [address]   Alcohol, Ethyl (B) 02/01/2024 <10  <10 mg/dL Final   Comment: (NOTE) Lowest detectable limit for serum alcohol is 10 mg/dL.  For medical purposes only. Performed at [facility], [address] [place], [place] [address]    Salicylate Lvl 02/01/2024 <7.0 (L)  7.0 - 30.0 mg/dL Final   Performed at [facility], [address] [place], [place] [address]   Acetaminophen (Tylenol), Serum 02/01/2024 <10 (L)  10 - 30 ug/mL Final   Comment: (NOTE) Therapeutic concentrations vary significantly. A range of 10-30 ug/mL  may be an effective concentration for many patients. However, some  are best treated at concentrations outside of this range. Acetaminophen concentrations >150 ug/mL at 4 hours after ingestion  and >50 ug/mL at 12 hours after ingestion are often associated with  toxic reactions.  Performed at [facility], [address] [place], [place] [address]    WBC 02/01/2024 13.0 (H)  4.0 -  10.5 K/uL Final   RBC 02/01/2024 4.98  4.22 - 5.81 MIL/uL Final   Hemoglobin 02/01/2024 15.7  13.0 - 17.0 g/dL Final   HCT 11/91/4782 47.3  39.0 -  52.0 % Final   MCV 02/01/2024 95.0  80.0 - 100.0 fL Final   MCH 02/01/2024 31.5  26.0 - 34.0 pg Final   MCHC 02/01/2024 33.2  30.0 - 36.0 g/dL Final   RDW 95/62/1308 12.6  11.5 - 15.5 % Final   Platelets 02/01/2024 337  150 - 400 K/uL Final   nRBC 02/01/2024 0.0  0.0 - 0.2 % Final   Performed at [facility], [address] [place], [place] [address]   Tricyclic, Ur Screen 02/01/2024 NONE DETECTED  NONE DETECTED Final   Amphetamines, Ur Screen 02/01/2024 POSITIVE (A)  NONE DETECTED Final   Comment: (NOTE) Trazodone is metabolized in vivo to several metabolites, including pharmacologically active m-CPP, which is excreted in the urine. Immunoassay screens for amphetamines and MDMA have potential cross-reactivity with these compounds and may provide false positive  results.     MDMA (Ecstasy)Ur Screen 02/01/2024 NONE DETECTED  NONE DETECTED Final   Cocaine Metabolite,Ur [place] 02/01/2024 NONE DETECTED  NONE DETECTED Final   Opiate, Ur Screen 02/01/2024 NONE DETECTED  NONE DETECTED Final   Phencyclidine (PCP) Ur S 02/01/2024 NONE DETECTED  NONE DETECTED Final   Cannabinoid 50 Ng, Ur [place] 02/01/2024 POSITIVE (A)  NONE DETECTED Final   Barbiturates, Ur Screen 02/01/2024 NONE DETECTED  NONE DETECTED Final   Benzodiazepine, Ur Scrn 02/01/2024 NONE DETECTED  NONE DETECTED Final   Methadone Scn, Ur 02/01/2024 NONE DETECTED  NONE DETECTED Final   Comment: (NOTE) Tricyclics + metabolites, urine    Cutoff 1000 ng/mL Amphetamines + metabolites, urine  Cutoff 1000 ng/mL MDMA (Ecstasy), urine              Cutoff 500 ng/mL Cocaine Metabolite, urine          Cutoff 300 ng/mL Opiate + metabolites, urine        Cutoff 300 ng/mL Phencyclidine (PCP), urine         Cutoff 25 ng/mL Cannabinoid, urine                 Cutoff 50 ng/mL Barbiturates + metabolites, urine  Cutoff 200 ng/mL Benzodiazepine, urine              Cutoff 200 ng/mL Methadone, urine                   Cutoff 300 ng/mL  The urine  drug screen provides only a preliminary, unconfirmed analytical test result and should not be used for non-medical purposes. Clinical consideration and professional judgment should be applied to any positive drug screen result due to possible interfering substances. A more specific alternate chemical method must be used in order to obtain a confirmed analytical result. Gas chromatography / mass spectrometry (GC/MS) is the preferred confirm                          atory method. Performed at [facility], [address] [place], [place] [address]     Blood Alcohol level:  Lab Results  Component Value Date   [facility] <10 02/01/2024   ETH <10 07/31/2023    Metabolic Disorder Labs: Lab Results  Component Value Date   HGBA1C <4.2 (L) 08/02/2023   MPG <74 08/02/2023   No results found for: "PROLACTIN" Lab Results  Component Value Date   CHOL 199 08/04/2023   TRIG 93 08/04/2023   HDL 52 08/04/2023   CHOLHDL 3.8 08/04/2023   VLDL 19 08/04/2023   LDLCALC 128 (H) 08/04/2023    Therapeutic Lab Levels: No results found for: "LITHIUM" Lab Results  Component Value Date   VALPROATE 99 02/07/2024   VALPROATE 62 02/04/2024   No results found for: "CBMZ"  Physical Findings   AIMS    Flowsheet Row [facility]  AIMS Total Score 0      AUDIT    Flowsheet Row ED from 02/02/2024 in [facility] Admission (Discharged[facility]  Alcohol Use Disorder Identification Test Final Score (AUDIT) 0 0      PHQ2-9    Flowsheet Row ED from 02/02/2024 in [facility]  PHQ-2 Total Score 1  PHQ-9 Total Score 6      Flowsheet Row ED from 02/02/2024 in [facility] ED from 02/01/2024 in [facility] Admission (Discharged) [facility]  [facility]  C-SSRS RISK CATEGORY No Risk No Risk No Risk        Musculoskeletal  Strength & Muscle Tone: within normal limits Gait & Station: normal Patient leans: N/A  Psychiatric Specialty Exam  Presentation  General Appearance:  Appropriate for Environment; Casual  Eye Contact: Good  Speech: Clear and Coherent; Normal Rate  Speech Volume: Normal  Handedness: -- (not assessed)   Mood and Affect  Mood: Euthymic  Affect: Appropriate; Congruent   Thought Process  Thought Processes: Coherent; Linear  Descriptions of Associations:Intact  Orientation:Full (Time, Place and Person)  Thought Content:Logical; WDL  Diagnosis of Schizophrenia or Schizoaffective disorder in past: No    Hallucinations:Hallucinations: None  Ideas of Reference:None  Suicidal Thoughts:Suicidal Thoughts: No  Homicidal Thoughts:Homicidal Thoughts: No   Sensorium  Memory: Immediate Good  Judgment: Fair  Insight: Fair   Executive Functions  Concentration: Good  Attention Span: Good  Recall: Good  Fund of Knowledge: Good  Language: Good   Psychomotor Activity  Psychomotor Activity: Psychomotor Activity: Normal   Assets  Assets: Communication Skills; Desire for Improvement; Physical Health; Resilience   Sleep  Sleep: Sleep: Good   No data recorded   Physical Exam  Physical Exam Vitals and nursing note reviewed.  Constitutional:      General: He is not in acute distress.    Appearance: Normal appearance. He is normal weight. He is not ill-appearing or toxic-appearing.  HENT:     Head: Normocephalic and atraumatic.  Pulmonary:     Effort: Pulmonary effort is normal.  Musculoskeletal:        General: Normal range of motion.  Neurological:     General: No focal deficit present.     Mental Status: He is alert.    Review of Systems  Respiratory:  Negative for cough and shortness of breath.    Cardiovascular:  Negative for chest pain.  Gastrointestinal:  Negative for abdominal pain, constipation, diarrhea, nausea and vomiting.  Neurological:  Negative for dizziness, weakness and headaches.  Psychiatric/Behavioral:  Negative for depression, hallucinations and suicidal ideas. The patient is not nervous/anxious.    Blood pressure 123/77, pulse (!) 104, temperature 98.2 F (36.8 C), temperature source Oral, resp. rate 18, SpO2 100%. There is no height or weight on file to calculate BMI.  Treatment  Plan Summary: Daily contact with patient to assess and evaluate symptoms and progress in treatment and Medication management   KIA STAVROS is a 23 y.o. male  with a past psychiatric history of stimulant use disorder (methamphetamine), bipolar 1 disorder, and a psychiatric hospitalization at [facility] in September 2024. Patient initially arrived to [facility] on 02/01/2024 under IVC for erratic behaviors in the setting of ongoing methamphetamine use and noncompliance on psychotropic medications to manage his manic episodes, and admitted to [facility]  on 02/03/2024 for substance related issues and intensive therapeutic interventions. UDS on admission is positive for amphetamines and THC.    Zian is tolerating detox well.  He is still interested in going to residential rehab and we will need to assist him with getting his court dates moved to facilitate this.  His Depakote level came back WNL and his CMP recheck was also WNL.  We will not make any changes to his medications at this time.  We will continue to monitor.   Status: under IVC, upheld, 2nd exam submitted on 02/04/2024     Psychiatric Diagnoses and Treatment:  Bipolar 1 disorder Continue Abilify 10 mg daily for mood stability Continue Depakote 500 mg q12  Plan to obtain VPA level on Sunday 3/30 morning -Continue Agitation Protocol: Zyprexa   Withdrawal: -Continue COWS, last score=2  @ 8469  3/30 -Continue Bentyl 20 mg q6 PRN  spasms -Continue Imodium 2-4 mg PRN diarrhea -Continue Robaxin 500 mg q8 PRN muscle spasms -Continue Naproxen 500 mg BID PRN pain -Continue Zofran-ODT 4 mg q6 PRN nausea   Nicotine Dependence: -Continue Nicotine Patch 21 mg daily -Continue Nicotine gum 2 mg PRN    -Continue PRN's: Tylenol, Maalox, Atarax, Milk of Magnesia    Medical Issues Being Addressed:  DECLINED STI SCREENING   HIV Continue Descovy 200-25 mg daily       --Other Labs/Imaging Reviewed   EKG on 02/02/2025: QTc 449      Discharge Planning:              -- Social work and case management to assist with discharge planning and identification of hospital follow-up needs prior to discharge             -- Disposition: Patient is reconsidering residential rehabilitation, barrier to placement may be his upcoming court dates.  Will plan to discuss alternative of CD IOP with patient.             -- Discharge Concerns: Need to establish a safety plan; Medication compliance and effectiveness             -- Discharge Goals: ;outpatient referrals for mental health follow-up including medication management/psychotherapy     Lauro Franklin, MD 02/07/2024 12:45 PM

## 2024-02-07 NOTE — ED Notes (Signed)
 Pt currently resting in bed.  Pt has been calm and cooperative thus far this shift

## 2024-02-07 NOTE — Group Note (Signed)
 Group Topic: Communication  Group Date: 02/07/2024 Start Time: 0900 End Time: 0920 Facilitators: Nelva Bush, RN  Department: [facility]  Number of Participants: 3  Group Focus: other  Medication Education Treatment Modality:  Individual Therapy Interventions utilized were patient education Purpose: increase insight  Name: Ronald Leach Date of Birth: 12-02-2000  MR: [id number]    Level of Participation: active Quality of Participation: cooperative Interactions with others:   minimal Mood/Affect: closed / guarded Triggers (if applicable): none identified Cognition: logical Progress: Gaining insight Response:  Plan: patient will be encouraged to engage in plan of care   Patients Problems:  Patient Active Problem List   Diagnosis Date Noted   Methamphetamine abuse (HCC) 02/02/2024   Substance abuse (HCC) 02/02/2024   Bipolar I disorder, most recent episode (or current) manic (HCC) 08/04/2023   Depression 08/01/2023   Bipolar affective disorder, depressed, severe (HCC) 07/31/2023   Methamphetamine dependence (HCC) 07/31/2023

## 2024-02-07 NOTE — ED Notes (Signed)
 IVC.   Pt has been calm and cooperative this shift.  Minimal eye contact noted.   OOB for breakfast and medications.  Denied current SI plan and intent.  Denied HI and A/V hallucinations.  No reports or observations of withdrawal noted.    Q 15 minute checks continue for safety

## 2024-02-07 NOTE — ED Notes (Signed)
 Patient is the bedroom sleeping with eyes closed. NAD Respirations are even and unlabored. Will monitor for safety.

## 2024-02-07 NOTE — ED Notes (Addendum)
 Alert and oriented x 4. Pleasant and cooperative .  Pt observed interacting appropriately with staff and peers. No self harm gestures or attempts noted or reported.  Safety maintained thus far this shift

## 2024-02-07 NOTE — ED Notes (Signed)
 Patient is sleeping. Respirations equal and unlabored, skin warm and dry. No change in assessment or acuity. Routine safety checks conducted according to facility protocol. Will continue to monitor for safety.

## 2024-02-07 NOTE — Group Note (Signed)
 Group Topic: Relaxation  Group Date: 02/07/2024 Start Time: 1100 End Time: 1130 Facilitators: Merlene Morse, NT  Department: [facility]  Number of Participants: 10  Group Focus: daily focus Treatment Modality:  Leisure Development Interventions utilized were leisure development Purpose: reinforce self-care  Name: EMETT STAPEL Date of Birth: 22-Oct-2001  MR: [id number]    Level of Participation: active Quality of Participation: attentive Interactions with others: gave feedback Mood/Affect: appropriate Triggers (if applicable):  Cognition: coherent/clear Progress: Gaining insight Response:  Plan: patient will be encouraged to continue to attend groups.  Patients Problems:  Patient Active Problem List   Diagnosis Date Noted   Methamphetamine abuse (HCC) 02/02/2024   Substance abuse (HCC) 02/02/2024   Bipolar I disorder, most recent episode (or current) manic (HCC) 08/04/2023   Depression 08/01/2023   Bipolar affective disorder, depressed, severe (HCC) 07/31/2023   Methamphetamine dependence (HCC) 07/31/2023

## 2024-02-07 NOTE — ED Notes (Signed)
 Provider notified of flagged pulse 104. No new orders given. Pt states, "my pulse usually run high". Will continue to monitor for safety.

## 2024-02-07 NOTE — Group Note (Signed)
 Group Topic: Healthy Self Image and Positive Change  Group Date: 02/07/2024 Start Time: 1930 End Time: 2100 Facilitators: Gigi Gin, NT  Department: [facility]  Number of Participants: 8  Group Focus: abuse issues, acceptance, activities of daily living skills, chemical dependency issues, co-dependency, communication, feeling awareness/expression, goals/reality orientation, healthy friendships, individual meeting, personal responsibility, problem solving, relapse prevention, self-esteem, social skills, and substance abuse education Treatment Modality:  Exposure Therapy, Interpersonal Therapy, Leisure Development, and Psychoeducation Interventions utilized were group exercise, leisure development, patient education, problem solving, story telling, and support Purpose: enhance coping skills, express feelings, express irrational fears, increase insight, reinforce self-care, relapse prevention strategies, and trigger / craving management  Name: ROLLY MAGRI Date of Birth: April 13, 2001  MR: [id number]    Level of Participation: active Quality of Participation: attentive, cooperative, and supportive Interactions with others: gave feedback Mood/Affect: appropriate and positive Triggers (if applicable): NA Cognition: coherent/clear Progress: Moderate Response: NA Plan: patient will be encouraged to continue going to group  Patients Problems:  Patient Active Problem List   Diagnosis Date Noted   Methamphetamine abuse (HCC) 02/02/2024   Substance abuse (HCC) 02/02/2024   Bipolar I disorder, most recent episode (or current) manic (HCC) 08/04/2023   Depression 08/01/2023   Bipolar affective disorder, depressed, severe (HCC) 07/31/2023   Methamphetamine dependence (HCC) 07/31/2023

## 2024-02-07 NOTE — ED Notes (Signed)
 Pt is in the dayroom watching TV with peers. Pt denies SI/HI/AVH. Pt has no further complain.No acute distress noted. Will continue to monitor for safety and provide support.

## 2024-02-08 DIAGNOSIS — F319 Bipolar disorder, unspecified: Secondary | ICD-10-CM | POA: Diagnosis not present

## 2024-02-08 DIAGNOSIS — F1910 Other psychoactive substance abuse, uncomplicated: Secondary | ICD-10-CM | POA: Diagnosis not present

## 2024-02-08 DIAGNOSIS — Z91148 Patient's other noncompliance with medication regimen for other reason: Secondary | ICD-10-CM | POA: Diagnosis not present

## 2024-02-08 DIAGNOSIS — Z79899 Other long term (current) drug therapy: Secondary | ICD-10-CM | POA: Diagnosis not present

## 2024-02-08 NOTE — ED Notes (Signed)
 Pt sitting in dayroom eating dinner and watching television. No acute distress noted. No concerns voiced. Informed pt to notify staff with any needs or assistance. Pt verbalized understanding and agreement. Will continue to monitor for safety.

## 2024-02-08 NOTE — Group Note (Signed)
 Group Topic: Positive Affirmations  Group Date: 02/08/2024 Start Time: 1730 End Time: 1745 Facilitators: Vonzell Schlatter B  Department: [facility]  Number of Participants: 3  Group Focus: affirmation and daily focus Treatment Modality:  Psychoeducation Interventions utilized were problem solving and support Purpose: express feelings and reinforce self-care  Name: Ronald Leach Date of Birth: 06-08-2001  MR: [id number]    Level of Participation: did not attend group Quality of Participation:  Interactions with others:  Mood/Affect:  Triggers (if applicable):  Cognition:  Progress:  Response:  Plan: follow-up needed  Patients Problems:  Patient Active Problem List   Diagnosis Date Noted   Methamphetamine abuse (HCC) 02/02/2024   Substance abuse (HCC) 02/02/2024   Bipolar I disorder, most recent episode (or current) manic (HCC) 08/04/2023   Depression 08/01/2023   Bipolar affective disorder, depressed, severe (HCC) 07/31/2023   Methamphetamine dependence (HCC) 07/31/2023

## 2024-02-08 NOTE — ED Notes (Signed)
 Patient is sleeping. Respirations equal and unlabored, skin warm and dry. No change in assessment or acuity. Routine safety checks conducted according to facility protocol. Will continue to monitor for safety.

## 2024-02-08 NOTE — ED Provider Notes (Signed)
 Behavioral Health Progress Note  Date and Time: 02/08/2024 11:02 AM Name: Ronald Leach MRN:  [id number]  Subjective:   DUSHAUN OKEY is a 23 y.o. male  with a past psychiatric history of stimulant use disorder (methamphetamine), bipolar 1 disorder, and a psychiatric hospitalization at [facility] in September 2024. Patient initially arrived to [facility] on 02/01/2024 under IVC for erratic behaviors in the setting of ongoing methamphetamine use and noncompliance on psychotropic medications to manage his manic episodes, and admitted to [facility]  on 02/03/2024 for substance related issues and intensive therapeutic interventions. UDS on admission is positive for amphetamines and THC.   Patient seen sleeping in his room, no acute distress. He reports good sleep and good appetite. He denies withdrawal symptoms from meth. He denies issues with taking Abilify and Depakote, stating "it keeps me on balance". He denies IVDU. He reports his motivating factors on stopping meth use includes his health and wanting to save money. He denies SI, HI, and AVH. He reports wanting to do CDIOP.    Diagnosis:  Final diagnoses:  Polysubstance abuse (HCC)  Bipolar I disorder, most recent episode (or current) manic (HCC)    Substance Use History: Tobacco: vapes daily, reports 1 cartidge will last him 2 weeks. Patient has been vaping since age 67.  Cannabis: Uses every other day, uses 0.5 gram per use -- uses it for anxiety  Cocaine: Denies Methamphetamines: 2-3 month hx of use, route smoking, no IVDU.  Psilocybin (mushrooms): Denies Ecstasy (MDMA / molly): Denies LSD (acid): never tried: Denies Opiates (fentanyl / heroin): Denies Benzos (Xanax, Klonopin): Denies IVDU: Denies Detox hx: No Rehab hx: No  Past Psychiatric History:  Seen by [facility] in [place] [facility]) Previous Psychiatric Diagnoses: bipolar I disorder, stimulant use disorder Current psychiatric medications: depakote, abilify,  hydroxyzyne -- Psychiatric Hospitalization hx: The patient has been previously hospitalized for psychiatric and substance use treatment on at least three occasions, most recently at [facility] from 08/01/23 to 08/06/23   Past Medical History:  PCP: has f/u with [facility] Medical Dx: Denies Medications: Denies Allergies: Denies Surgeries: denies Trauma: Denies Seizures:Denies  Family History:  Reports 2 aunts with diabetes   Family Psychiatric  History:  Psychiatric Dx: Maternal aunt - bipolar  Suicide NW:GNFAOZ Violence/Aggression:Denies Substance HYQ:MVHQIO  Social History:  Living Situation: with father in [place], [place] Social Support: Father, aunt, partner Education: completed some college Occupational hx: unemployed isnce August, quit [employer] due to mental health Marital Status: Single Children: Denies Legal: Has traffic court on April 4th and 19th Military: Denies Access to firearms: Denies   Sleep: Good  Appetite:  Good  Current Medications:  Current Facility-Administered Medications  Medication Dose Route Frequency Provider Last Rate Last Admin   acetaminophen (TYLENOL) tablet 650 mg  650 mg Oral Q6H PRN Sindy Guadeloupe, NP       alum & mag hydroxide-simeth (MAALOX/MYLANTA) 200-200-20 MG/5ML suspension 30 mL  30 mL Oral Q4H PRN Sindy Guadeloupe, NP       ARIPiprazole (ABILIFY) tablet 10 mg  10 mg Oral Daily Myriam Forehand, NP   10 mg at 02/08/24 9629   divalproex (DEPAKOTE) DR tablet 500 mg  500 mg Oral Q12H Carrion-Carrero, Karle Starch, MD   500 mg at 02/08/24 5284   emtricitabine-tenofovir AF (DESCOVY) 200-25 MG per tablet 1 tablet  1 tablet Oral Daily Myriam Forehand, NP   1 tablet at 02/08/24 1324   hydrOXYzine (ATARAX) tablet 25 mg  25 mg Oral TID  Lorri Frederick, MD   25 mg at 02/08/24 1610   magnesium hydroxide (MILK OF MAGNESIA) suspension 30 mL  30 mL Oral Daily PRN Sindy Guadeloupe, NP       nicotine (NICODERM CQ - dosed in mg/24 hours) patch 21 mg   21 mg Transdermal Daily Carrion-Carrero, Karle Starch, MD   21 mg at 02/08/24 9604   nicotine polacrilex (NICORETTE) gum 2 mg  2 mg Oral PRN Myriam Forehand, NP       OLANZapine [facility]) injection 10 mg  10 mg Intramuscular TID PRN Sindy Guadeloupe, NP       OLANZapine (ZYPREXA) injection 5 mg  5 mg Intramuscular TID PRN Sindy Guadeloupe, NP       OLANZapine zydis (ZYPREXA) disintegrating tablet 5 mg  5 mg Oral TID PRN Sindy Guadeloupe, NP       Current Outpatient Medications  Medication Sig Dispense Refill   ARIPiprazole (ABILIFY) 10 MG tablet Take 1 tablet (10 mg total) by mouth daily. 30 tablet 0   divalproex (DEPAKOTE ER) 500 MG 24 hr tablet Take 1,500 mg by mouth daily.     docusate sodium (COLACE) 100 MG capsule Take 1 capsule (100 mg total) by mouth 2 (two) times daily. (Patient not taking: Reported on 02/02/2024)     hydrOXYzine (ATARAX) 25 MG tablet Take 25 mg by mouth 3 (three) times daily.     nicotine polacrilex (NICORETTE) 2 MG gum Take 1 each (2 mg total) by mouth as needed for smoking cessation. (Patient not taking: Reported on 02/02/2024) 40 tablet 0    Labs  Lab Results:  Admission on 02/02/2024  Component Date Value Ref Range Status   Valproic Acid Lvl 02/04/2024 62  50.0 - 100.0 ug/mL Final   Performed at [facility], [address] [place], [place] [address]   Valproic Acid Lvl 02/07/2024 99  50.0 - 100.0 ug/mL Final   Performed at [facility], [address] [place], [place] [address]   Sodium 02/07/2024 139  135 - 145 mmol/L Final   Potassium 02/07/2024 4.0  3.5 - 5.1 mmol/L Final   Chloride 02/07/2024 102  98 - 111 mmol/L Final   CO2 02/07/2024 29  22 - 32 mmol/L Final   Glucose, Bld 02/07/2024 98  70 - 99 mg/dL Final   Glucose reference range applies only to samples taken after fasting for at least 8 hours.   BUN 02/07/2024 9  6 - 20 mg/dL Final   Creatinine, Ser 02/07/2024 0.74  0.61 - 1.24 mg/dL Final   Calcium 78/29/5621 9.3  8.9 - 10.3 mg/dL Final   Total  Protein 02/07/2024 6.4 (L)  6.5 - 8.1 g/dL Final   Albumin 30/86/5784 3.7  3.5 - 5.0 g/dL Final   AST 69/62/9528 12 (L)  15 - 41 U/L Final   ALT 02/07/2024 10  0 - 44 U/L Final   Alkaline Phosphatase 02/07/2024 60  38 - 126 U/L Final   Total Bilirubin 02/07/2024 0.4  0.0 - 1.2 mg/dL Final   GFR, Estimated 02/07/2024 >60  >60 mL/min Final   Comment: (NOTE) Calculated using the CKD-EPI Creatinine Equation (2021)    Anion gap 02/07/2024 8  5 - 15 Final   Performed at [facility], [address], [place], [place] [address]  Admission on 02/01/2024, Discharged on 02/02/2024  Component Date Value Ref Range Status   Sodium 02/01/2024 140  135 - 145 mmol/L Final   Potassium 02/01/2024 4.0  3.5 - 5.1 mmol/L Final   Chloride 02/01/2024 102  98 - 111 mmol/L Final   CO2 02/01/2024 28  22 - 32 mmol/L Final   Glucose, Bld 02/01/2024 92  70 - 99 mg/dL Final   Glucose reference range applies only to samples taken after fasting for at least 8 hours.   BUN 02/01/2024 8  6 - 20 mg/dL Final   Creatinine, Ser 02/01/2024 0.83  0.61 - 1.24 mg/dL Final   Calcium 86/57/8469 9.4  8.9 - 10.3 mg/dL Final   Total Protein 62/95/2841 7.0  6.5 - 8.1 g/dL Final   Albumin 32/44/0102 4.3  3.5 - 5.0 g/dL Final   AST 72/53/6644 16  15 - 41 U/L Final   ALT 02/01/2024 12  0 - 44 U/L Final   Alkaline Phosphatase 02/01/2024 70  38 - 126 U/L Final   Total Bilirubin 02/01/2024 0.7  0.0 - 1.2 mg/dL Final   GFR, Estimated 02/01/2024 >60  >60 mL/min Final   Comment: (NOTE) Calculated using the CKD-EPI Creatinine Equation (2021)    Anion gap 02/01/2024 10  5 - 15 Final   Performed at [facility], [address] [place], [place] [address]   Alcohol, Ethyl (B) 02/01/2024 <10  <10 mg/dL Final   Comment: (NOTE) Lowest detectable limit for serum alcohol is 10 mg/dL.  For medical purposes only. Performed at [facility], [address] [place], [place] [address]    Salicylate Lvl 02/01/2024 <7.0  (L)  7.0 - 30.0 mg/dL Final   Performed at [facility], [address] [place], [place] [address]   Acetaminophen (Tylenol), Serum 02/01/2024 <10 (L)  10 - 30 ug/mL Final   Comment: (NOTE) Therapeutic concentrations vary significantly. A range of 10-30 ug/mL  may be an effective concentration for many patients. However, some  are best treated at concentrations outside of this range. Acetaminophen concentrations >150 ug/mL at 4 hours after ingestion  and >50 ug/mL at 12 hours after ingestion are often associated with  toxic reactions.  Performed at [facility], [address] [place], [place] [address]    WBC 02/01/2024 13.0 (H)  4.0 - 10.5 K/uL Final   RBC 02/01/2024 4.98  4.22 - 5.81 MIL/uL Final   Hemoglobin 02/01/2024 15.7  13.0 - 17.0 g/dL Final   HCT 51/88/4166 47.3  39.0 - 52.0 % Final   MCV 02/01/2024 95.0  80.0 - 100.0 fL Final   MCH 02/01/2024 31.5  26.0 - 34.0 pg Final   MCHC 02/01/2024 33.2  30.0 - 36.0 g/dL Final   RDW 05/09/1600 12.6  11.5 - 15.5 % Final   Platelets 02/01/2024 337  150 - 400 K/uL Final   nRBC 02/01/2024 0.0  0.0 - 0.2 % Final   Performed at [facility], [address] [place], [place] [address]   Tricyclic, Ur Screen 02/01/2024 NONE DETECTED  NONE DETECTED Final   Amphetamines, Ur Screen 02/01/2024 POSITIVE (A)  NONE DETECTED Final   Comment: (NOTE) Trazodone is metabolized in vivo to several metabolites, including pharmacologically active m-CPP, which is excreted in the urine. Immunoassay screens for amphetamines and MDMA have potential cross-reactivity with these compounds and may provide false positive  results.     MDMA (Ecstasy)Ur Screen 02/01/2024 NONE DETECTED  NONE DETECTED Final   Cocaine Metabolite,Ur [place] 02/01/2024 NONE DETECTED  NONE DETECTED Final   Opiate, Ur Screen 02/01/2024 NONE DETECTED  NONE DETECTED Final   Phencyclidine (PCP) Ur S 02/01/2024 NONE DETECTED  NONE DETECTED Final   Cannabinoid 50  Ng, Ur [place] 02/01/2024 POSITIVE (A)  NONE DETECTED Final   Barbiturates, Ur Screen 02/01/2024 NONE DETECTED  NONE DETECTED Final   Benzodiazepine, Ur Scrn 02/01/2024 NONE DETECTED  NONE DETECTED Final   Methadone Scn, Ur 02/01/2024 NONE DETECTED  NONE DETECTED Final   Comment: (NOTE) Tricyclics + metabolites, urine    Cutoff 1000 ng/mL Amphetamines + metabolites, urine  Cutoff 1000 ng/mL MDMA (Ecstasy), urine              Cutoff 500 ng/mL Cocaine Metabolite, urine          Cutoff 300 ng/mL Opiate + metabolites, urine        Cutoff 300 ng/mL Phencyclidine (PCP), urine         Cutoff 25 ng/mL Cannabinoid, urine                 Cutoff 50 ng/mL Barbiturates + metabolites, urine  Cutoff 200 ng/mL Benzodiazepine, urine              Cutoff 200 ng/mL Methadone, urine                   Cutoff 300 ng/mL  The urine drug screen provides only a preliminary, unconfirmed analytical test result and should not be used for non-medical purposes. Clinical consideration and professional judgment should be applied to any positive drug screen result due to possible interfering substances. A more specific alternate chemical method must be used in order to obtain a confirmed analytical result. Gas chromatography / mass spectrometry (GC/MS) is the preferred confirm                          atory method. Performed at [facility], [address] [place], [place] [address]     Blood Alcohol level:  Lab Results  Component Value Date   [facility] <10 02/01/2024   ETH <10 07/31/2023    Metabolic Disorder Labs: Lab Results  Component Value Date   HGBA1C <4.2 (L) 08/02/2023   MPG <74 08/02/2023   No results found for: "PROLACTIN" Lab Results  Component Value Date   CHOL 199 08/04/2023   TRIG 93 08/04/2023   HDL 52 08/04/2023   CHOLHDL 3.8 08/04/2023   VLDL 19 08/04/2023   LDLCALC 128 (H) 08/04/2023    Therapeutic Lab Levels: No results found for: "LITHIUM" Lab Results  Component Value Date    VALPROATE 99 02/07/2024   VALPROATE 62 02/04/2024   No results found for: "CBMZ"  Physical Findings   AIMS    Flowsheet Row [facility]  AIMS Total Score 0      AUDIT    Flowsheet Row ED from 02/02/2024 in [facility] Admission (Discharged[facility]  Alcohol Use Disorder Identification Test Final Score (AUDIT) 0 0      PHQ2-9    Flowsheet Row ED from 02/02/2024 in [facility]  PHQ-2 Total Score 1  PHQ-9 Total Score 6      Flowsheet Row ED from 02/02/2024 in [facility] ED from 02/01/2024 in [facility] Admission (Discharged) [facility]  C-SSRS RISK CATEGORY No Risk No Risk No Risk        Musculoskeletal  Strength & Muscle Tone:  within normal limits Gait & Station: normal Patient leans: N/A  Psychiatric Specialty Exam  Presentation  General Appearance:  Appropriate for Environment; Fairly Groomed  Eye Contact: Fair  Speech: Clear and Coherent; Normal Rate  Speech Volume: Normal  Handedness: Right   Mood and Affect  Mood: Euthymic  Affect: Congruent; Appropriate   Thought Process  Thought Processes: Coherent  Descriptions of Associations:Intact  Orientation:Full (Time, Place and Person)  Thought Content:Logical  Diagnosis of Schizophrenia or Schizoaffective disorder in past: No    Hallucinations:Hallucinations: None  Ideas of Reference:None  Suicidal Thoughts:Suicidal Thoughts: No  Homicidal Thoughts:Homicidal Thoughts: No   Sensorium  Memory: Remote Good  Judgment: Fair  Insight: Fair   Art therapist  Concentration: Good  Attention Span: Good  Recall: Good  Fund of  Knowledge: Good  Language: Good   Psychomotor Activity  Psychomotor Activity: Psychomotor Activity: Normal   Assets  Assets: Communication Skills; Resilience; Social Support   Sleep  Sleep: Sleep: Good    Physical Exam  Physical Exam Vitals and nursing note reviewed.  Constitutional:      General: He is not in acute distress.    Appearance: Normal appearance. He is normal weight. He is not ill-appearing or toxic-appearing.  HENT:     Head: Normocephalic and atraumatic.  Pulmonary:     Effort: Pulmonary effort is normal.  Musculoskeletal:        General: Normal range of motion.  Neurological:     General: No focal deficit present.     Mental Status: He is alert.    Review of Systems  Respiratory:  Negative for cough and shortness of breath.   Cardiovascular:  Negative for chest pain.  Gastrointestinal:  Negative for abdominal pain, constipation, diarrhea, nausea and vomiting.  Neurological:  Negative for dizziness, weakness and headaches.  Psychiatric/Behavioral:  Negative for depression, hallucinations and suicidal ideas. The patient is not nervous/anxious.    Blood pressure 113/71, pulse 95, temperature 98.3 F (36.8 C), temperature source Oral, resp. rate 18, SpO2 98%. There is no height or weight on file to calculate BMI.  Treatment Plan Summary: Daily contact with patient to assess and evaluate symptoms and progress in treatment and Medication management   KARDELL VIRGIL is a 23 y.o. male  with a past psychiatric history of stimulant use disorder (methamphetamine), bipolar 1 disorder, and a psychiatric hospitalization at [facility] in September 2024. Patient [facility] on 02/01/2024 under IVC for erratic behaviors in the setting of ongoing methamphetamine use and noncompliance on psychotropic medications to manage his manic episodes, and admitted to [facility]  on 02/03/2024 for substance related issues and intensive therapeutic interventions. UDS on  admission is positive for amphetamines and THC.    Sylis is tolerating detox well.  Patient is interested in CDIOP as he has an upcoming court date for 4/4. His Depakote level came back WNL and his CMP recheck was also WNL.  We will not make any changes to his medications at this time.  We will continue to monitor.   Status: under IVC, upheld, 2nd exam submitted on 02/04/2024     Psychiatric Diagnoses and Treatment:  Bipolar 1 disorder Continue Abilify 10 mg daily for mood stability Continue Depakote 500 mg q12h  VPA level on Sunday 3/30: 99 -Continue Agitation Protocol: Zyprexa   Withdrawal: -Continue COWS, last score=2  -Continue Bentyl 20 mg q6 PRN spasms -Continue Imodium 2-4 mg PRN diarrhea -Continue Robaxin 500 mg q8 PRN muscle spasms -Continue Naproxen 500 mg  BID PRN pain -Continue Zofran-ODT 4 mg q6 PRN nausea   Nicotine Dependence: -Continue Nicotine Patch 21 mg daily -Continue Nicotine gum 2 mg PRN    -Continue PRN's: Tylenol, Maalox, Atarax, Milk of Magnesia    Medical Issues Being Addressed:  DECLINED STI SCREENING   HIV Continue Descovy 200-25 mg daily     --Other Labs/Imaging Reviewed   EKG on 02/02/2025: QTc 449     Discharge Planning:              -- Social work and case management to assist with discharge planning and identification of hospital follow-up needs prior to discharge             -- Disposition: CDIOP pending             -- Discharge Concerns: Need to establish a safety plan; Medication compliance and effectiveness             -- Discharge Goals: ;outpatient referrals for mental health follow-up including medication management/psychotherapy     Lance Muss, MD 02/08/2024 11:02 AM

## 2024-02-08 NOTE — ED Notes (Signed)
 Pt is in the dayroom watching TV with peers. Pt denies SI/HI/AVH. Pt has no further complain.No acute distress noted. Will continue to monitor for safety and provide support.

## 2024-02-08 NOTE — Discharge Planning (Signed)
 LCSW was informed by MD that patient is requesting to discharge back home with his father once stable for discharge. MD made aware that conversation has been held with the patient's father and he is not allowing the patient to return to his home at discharge. LCSW will follow up with the patient's dad to see if something has changed since last conversation. Per MD, patient is declining residential placement at this time. MD aware that due to patient's upcoming court dates on February 12, 2024 and February 27, 2024, placement will likely be an issue for him. MD aware that most facilities require that legal matters be resolved before admission. MD expressed understanding.   LCSW went and spoke with patient who states he has spoken with his father and he believes his father will allow him to return home once ready for discharge. Patient asked if LCSW could follow up with father to explain current barrier for treatment. LCSW in agreement with request. Updates to be provided once received.   LCSW contacted the patient's Father Ronald Leach [phone] to discuss disposition plans.  LCSW informed father that the patient is currently facing barriers for placement due to legal obligations.  Father expressed understanding.  LCSW explored if the patient returning home with him is an option at this time.  Father reports some concerns with the patient returning home and ultimately going back into the same environment as before.  Brief supportive counseling was provided to the father, and father was made aware that patient would not be able to remain at the [facility] due to having nowhere to reside.  Father reports he is willing to allow the patient to come back into his home, however reports the patient would need to be compliant with his outpatient follow-up.  Father reported some frustration regarding the patient's inability to be consistent with outpatient follow-up and recommendations.  Father aware the LCSW will make an  attempt to locate chemical dependency intensive outpatient services in [facility] for the patient.  Father expressed appreciation of LCSW assistance in conversation.  Father reports when patient is ready for discharge he and the patient's aunt would pick him up.  Father aware that LCSW will follow up regarding discharge appointments.  Father also made aware that additional resources will be provided to patient discharge paperwork.  No other concerns to report at this time.  LCSW will follow up with MD regarding plan.  LCSW contacted [facility] in [facility] regarding outpatient follow up. Per Assigned RN, agency is currently closed for today and will open again on tomorrow. RN reports their are two providers available for MAT treatment if interested. Contact information to be added to AVS.  LCSW followed up with [facility] in [place] for CDIOP services. Intake appointment has been scheduled for 02/10/2024 at 10:00am. Patient advised to arrive 15 minutes prior to appointment for check-in. Patient to present photo ID and insurance card to front desk. Patient will need to bring any prescription medications in their original bottle on day of appt. LCSW called patient's Father and left voice message with information listed above. No other needs to report at this time.   LCSW will continue to follow and provide support to patient while on FBC.   Ronald Boyden, LCSW Clinical Social Worker [facility] Ph: [phone]

## 2024-02-08 NOTE — Group Note (Signed)
 Group Topic: Communication  Group Date: 02/08/2024 Start Time: 0915 End Time: 1000 Facilitators: Prentice Docker, RN  Department: [facility]  Number of Participants: 7  Group Focus: check in Treatment Modality:  Individual Therapy Interventions utilized were patient education Purpose: increase insight  Name: Ronald Leach Date of Birth: 2001/07/09  MR: [id number]    Level of Participation: active Quality of Participation: cooperative Interactions with others: gave feedback Mood/Affect: appropriate Triggers (if applicable): none identified Cognition: coherent/clear Progress: Gaining insight Response: verbalized understanding of indications of medications taken Plan: patient will be encouraged to remain compliant with medications prescribed after discharge  Patients Problems:  Patient Active Problem List   Diagnosis Date Noted   Methamphetamine abuse (HCC) 02/02/2024   Substance abuse (HCC) 02/02/2024   Bipolar I disorder, most recent episode (or current) manic (HCC) 08/04/2023   Depression 08/01/2023   Bipolar affective disorder, depressed, severe (HCC) 07/31/2023   Methamphetamine dependence (HCC) 07/31/2023

## 2024-02-08 NOTE — ED Notes (Signed)
 Pt sleeping in no acute distress. RR even and unlabored. Environment secured. Will continue to monitor for safety.

## 2024-02-08 NOTE — ED Notes (Signed)
 Patient A&Ox4. Denies intent to harm self/others when asked. Denies A/VH. Patient denies any physical complaints when asked. No acute distress noted. Support and encouragement provided. Routine safety checks conducted according to facility protocol. Encouraged patient to notify staff if thoughts of harm toward self or others arise. Patient verbalize understanding and agreement. Will continue to monitor for safety.

## 2024-02-09 DIAGNOSIS — F1910 Other psychoactive substance abuse, uncomplicated: Secondary | ICD-10-CM | POA: Diagnosis not present

## 2024-02-09 DIAGNOSIS — Z91148 Patient's other noncompliance with medication regimen for other reason: Secondary | ICD-10-CM | POA: Diagnosis not present

## 2024-02-09 DIAGNOSIS — Z79899 Other long term (current) drug therapy: Secondary | ICD-10-CM | POA: Diagnosis not present

## 2024-02-09 DIAGNOSIS — F319 Bipolar disorder, unspecified: Secondary | ICD-10-CM | POA: Diagnosis not present

## 2024-02-09 MED ORDER — NICOTINE 21 MG/24HR TD PT24: 21.0000 mg | MEDICATED_PATCH | Freq: Every day | TRANSDERMAL | 0 refills | Status: AC

## 2024-02-09 MED ORDER — ARIPIPRAZOLE 10 MG PO TABS: 10.0000 mg | ORAL_TABLET | Freq: Every day | ORAL | 0 refills | Status: AC

## 2024-02-09 MED ORDER — DIVALPROEX SODIUM 500 MG PO DR TAB: 500.0000 mg | DELAYED_RELEASE_TABLET | Freq: Two times a day (BID) | ORAL | 0 refills | Status: AC

## 2024-02-09 MED ORDER — EMTRICITABINE-TENOFOVIR AF 200-25 MG PO TABS: 1.0000 | ORAL_TABLET | Freq: Every day | ORAL | Status: AC

## 2024-02-09 MED ORDER — HYDROXYZINE HCL 25 MG PO TABS: 25.0000 mg | ORAL_TABLET | Freq: Three times a day (TID) | ORAL | 0 refills | Status: AC

## 2024-02-09 NOTE — ED Notes (Signed)
 Patient is sleeping. Respirations equal and unlabored, skin warm and dry. No change in assessment or acuity. Routine safety checks conducted according to facility protocol. Will continue to monitor for safety.

## 2024-02-09 NOTE — ED Provider Notes (Signed)
 FBC/OBS ASAP Discharge Summary  Date and Time: 02/09/2024 8:48 AM  Name: Ronald Leach  MRN:  [id number]   Discharge Diagnoses:  Final diagnoses:  Polysubstance abuse (HCC)  Bipolar I disorder, most recent episode (or current) manic (HCC)    Subjective: Ronald Leach is a 23 y.o. male  with a past psychiatric history of stimulant use disorder (methamphetamine), bipolar 1 disorder, and a psychiatric hospitalization at [facility] in September 2024. Patient initially arrived to [facility] on 02/01/2024 under IVC for erratic behaviors in the setting of ongoing methamphetamine use and noncompliance on psychotropic medications to manage his manic episodes, and admitted to [facility]  on 02/03/2024 for substance related issues and intensive therapeutic interventions. UDS on admission is positive for amphetamines and THC.   Stay Summary: The patient was evaluated each day by a clinical provider to ascertain response to treatment. Improvement was noted by the patient's report of decreasing symptoms, improved sleep and appetite, affect, medication tolerance, behavior, and participation in unit programming.  Patient was asked each day to complete a self inventory noting mood, mental status, pain, new symptoms, anxiety and concerns.  The patient's medications were managed with the following directions: -restarted home Abilify and depakote- changed depakote from 1500 mg daily to 500 mg q12h -depakote level on 3/30 was 99  Patient responded well to medication and being in a therapeutic and supportive environment. Positive and appropriate behavior was noted and the patient was motivated for recovery. The patient worked closely with the treatment team and case manager to develop a discharge plan with appropriate goals. Coping skills, problem solving as well as relaxation therapies were also part of the unit programming. Patient has denied SI and HI for over 48 hours.    Total Time spent with patient: 20 minutes  Substance  Use History: Tobacco: vapes daily, reports 1 cartidge will last him 2 weeks. Patient has been vaping since age 65.  Cannabis: Uses every other day, uses 0.5 gram per use -- uses it for anxiety  Cocaine: Denies Methamphetamines: 2-3 month hx of use, route smoking, no IVDU.  Psilocybin (mushrooms): Denies Ecstasy (MDMA / molly): Denies LSD (acid): never tried: Denies Opiates (fentanyl / heroin): Denies Benzos (Xanax, Klonopin): Denies IVDU: Denies Detox hx: No Rehab hx: No   Past Psychiatric History:  Seen by [facility] in [place] ([employer]) Previous Psychiatric Diagnoses: bipolar I disorder, stimulant use disorder Current psychiatric medications: depakote, abilify, hydroxyzyne -- Psychiatric Hospitalization hx: The patient has been previously hospitalized for psychiatric and substance use treatment on at least three occasions, most recently at [facility] from 08/01/23 to 08/06/23    Past Medical History:  PCP: has f/u with [facility] Medical Dx: Denies Medications: Denies Allergies: Denies Surgeries: denies Trauma: Denies Seizures:Denies   Family History:  Reports 2 aunts with diabetes    Family Psychiatric  History:  Psychiatric Dx: Maternal aunt - bipolar  Suicide WU:[id number] Violence/Aggression:Denies Substance YNW:[id number]   Social History:  Living Situation: with father in [place], [place] Social Support: Father, aunt, partner Education: completed some college Occupational hx: unemployed isnce August, quit [employer] due to mental health Marital Status: Single Children: Denies Legal: Has traffic court on April 4th and 19th Military: Denies Access to firearms: Denies  Tobacco Cessation:  A prescription for an FDA-approved tobacco cessation medication provided at discharge  Current Medications:  Current Facility-Administered Medications  Medication Dose Route Frequency Provider Last Rate Last Admin   acetaminophen (TYLENOL) tablet 650 mg   650 mg Oral  Q6H PRN Sindy Guadeloupe, NP       alum & mag hydroxide-simeth (MAALOX/MYLANTA) 200-200-20 MG/5ML suspension 30 mL  30 mL Oral Q4H PRN Sindy Guadeloupe, NP       ARIPiprazole (ABILIFY) tablet 10 mg  10 mg Oral Daily Myriam Forehand, NP   10 mg at 02/08/24 9563   divalproex (DEPAKOTE) DR tablet 500 mg  500 mg Oral Q12H Carrion-Carrero, Karle Starch, MD   500 mg at 02/08/24 2134   emtricitabine-tenofovir AF (DESCOVY) 200-25 MG per tablet 1 tablet  1 tablet Oral Daily Myriam Forehand, NP   1 tablet at 02/08/24 8756   hydrOXYzine (ATARAX) tablet 25 mg  25 mg Oral TID Lorri Frederick, MD   25 mg at 02/08/24 2134   magnesium hydroxide (MILK OF MAGNESIA) suspension 30 mL  30 mL Oral Daily PRN Sindy Guadeloupe, NP       nicotine (NICODERM CQ - dosed in mg/24 hours) patch 21 mg  21 mg Transdermal Daily Carrion-Carrero, Karle Starch, MD   21 mg at 02/08/24 4332   nicotine polacrilex (NICORETTE) gum 2 mg  2 mg Oral PRN Myriam Forehand, NP       OLANZapine [facility]) injection 10 mg  10 mg Intramuscular TID PRN Sindy Guadeloupe, NP       OLANZapine (ZYPREXA) injection 5 mg  5 mg Intramuscular TID PRN Sindy Guadeloupe, NP       OLANZapine zydis (ZYPREXA) disintegrating tablet 5 mg  5 mg Oral TID PRN Sindy Guadeloupe, NP       Current Outpatient Medications  Medication Sig Dispense Refill   ARIPiprazole (ABILIFY) 10 MG tablet Take 1 tablet (10 mg total) by mouth daily. 30 tablet 0   divalproex (DEPAKOTE) 500 MG DR tablet Take 1 tablet (500 mg total) by mouth every 12 (twelve) hours. 60 tablet 0   emtricitabine-tenofovir AF (DESCOVY) 200-25 MG tablet Take 1 tablet by mouth daily.     hydrOXYzine (ATARAX) 25 MG tablet Take 1 tablet (25 mg total) by mouth 3 (three) times daily. 30 tablet 0   nicotine (NICODERM CQ - DOSED IN MG/24 HOURS) 21 mg/24hr patch Place 1 patch (21 mg total) onto the skin daily. 28 patch 0    PTA Medications:  Facility Ordered Medications  Medication   acetaminophen (TYLENOL) tablet 650 mg   alum &  mag hydroxide-simeth (MAALOX/MYLANTA) 200-200-20 MG/5ML suspension 30 mL   magnesium hydroxide (MILK OF MAGNESIA) suspension 30 mL   [EXPIRED] dicyclomine (BENTYL) tablet 20 mg   [EXPIRED] loperamide (IMODIUM) capsule 2-4 mg   [EXPIRED] methocarbamol (ROBAXIN) tablet 500 mg   [EXPIRED] ondansetron (ZOFRAN-ODT) disintegrating tablet 4 mg   OLANZapine zydis (ZYPREXA) disintegrating tablet 5 mg   OLANZapine (ZYPREXA) injection 5 mg   OLANZapine (ZYPREXA) injection 10 mg   ARIPiprazole (ABILIFY) tablet 10 mg   nicotine polacrilex (NICORETTE) gum 2 mg   emtricitabine-tenofovir AF (DESCOVY) 200-25 MG per tablet 1 tablet   nicotine (NICODERM CQ - dosed in mg/24 hours) patch 21 mg   divalproex (DEPAKOTE) DR tablet 500 mg   hydrOXYzine (ATARAX) tablet 25 mg   PTA Medications  Medication Sig   emtricitabine-tenofovir AF (DESCOVY) 200-25 MG tablet Take 1 tablet by mouth daily.   ARIPiprazole (ABILIFY) 10 MG tablet Take 1 tablet (10 mg total) by mouth daily.   hydrOXYzine (ATARAX) 25 MG tablet Take 1 tablet (25 mg total) by mouth 3 (three) times daily.   nicotine (NICODERM CQ - DOSED IN MG/24 HOURS) 21 mg/24hr patch Place  1 patch (21 mg total) onto the skin daily.   divalproex (DEPAKOTE) 500 MG DR tablet Take 1 tablet (500 mg total) by mouth every 12 (twelve) hours.       02/09/2024    8:45 AM 02/06/2024   11:24 AM 02/03/2024    5:18 PM  Depression screen PHQ 2/9  Decreased Interest 1 1 0  Down, Depressed, Hopeless 0 0 1  PHQ - 2 Score 1 1 1   Altered sleeping  1 1  Tired, decreased energy  1 1  Change in appetite  1 1  Feeling bad or failure about yourself   1 0  Trouble concentrating  0 1  Moving slowly or fidgety/restless  1 0  Suicidal thoughts  0 0  PHQ-9 Score  6 5  Difficult doing work/chores  Somewhat difficult     Flowsheet Row ED from 02/02/2024 [facility] ED from 02/01/2024 in [facility] Department at [facility] Admission  (Discharged[facility]  C-SSRS RISK CATEGORY No Risk No Risk No Risk       Musculoskeletal  Strength & Muscle Tone: within normal limits Gait & Station: normal Patient leans: N/A  Psychiatric Specialty Exam  Presentation  General Appearance:  Appropriate for Environment; Fairly Groomed  Eye Contact: Fair  Speech: Clear and Coherent; Normal Rate  Speech Volume: Normal  Handedness: Right   Mood and Affect  Mood: Euthymic  Affect: Congruent; Appropriate   Thought Process  Thought Processes: Coherent  Descriptions of Associations:Intact  Orientation:Full (Time, Place and Person)  Thought Content:Logical  Diagnosis of Schizophrenia or Schizoaffective disorder in past: No    Hallucinations:Hallucinations: None  Ideas of Reference:None  Suicidal Thoughts:Suicidal Thoughts: No  Homicidal Thoughts:Homicidal Thoughts: No   Sensorium  Memory: Remote Good  Judgment: Fair  Insight: Fair   Art therapist  Concentration: Good  Attention Span: Good  Recall: Good  Fund of Knowledge: Good  Language: Good   Psychomotor Activity  Psychomotor Activity: Psychomotor Activity: Normal   Assets  Assets: Communication Skills; Resilience; Social Support   Sleep  Sleep: Sleep: Good    Physical Exam   Physical Exam  General: Pleasant, well-appearing. No acute distress. Pulmonary: Normal effort. No wheezing or rales. Skin: No obvious rash or lesions. Neuro: A&Ox3.No focal deficit.   Review of Systems  No reported symptoms   Blood pressure 105/73, pulse 89, temperature (!) 97.1 F (36.2 C), temperature source Oral, resp. rate 16, SpO2 99%. There is no height or weight on file to calculate BMI.  Demographic Factors:  Adolescent or young adult and Caucasian  Loss Factors: NA  Historical Factors: Impulsivity  Risk Reduction Factors:   Living with another person,  especially a relative  Continued Clinical Symptoms:  Bipolar Disorder:   Depressive phase  Cognitive Features That Contribute To Risk:  Closed-mindedness    Suicide Risk:  Mild:  Suicidal ideation of limited frequency, intensity, duration, and specificity.  There are no identifiable plans, no associated intent, mild dysphoria and related symptoms, good self-control (both objective and subjective assessment), few other risk factors, and identifiable protective factors, including available and accessible social support.  Plan Of Care/Follow-up recommendations:  Activity as tolerated Regular diet Continue prescription medications See PCP for medical conditions   Disposition: Home with CDIOP appointment for 4/2 at 10 AM  Lance Muss, MD 02/09/2024, 8:48 AM

## 2024-06-02 ENCOUNTER — Ambulatory Visit
Admission: EM | Admit: 2024-06-02 | Discharge: 2024-06-02 | Disposition: A | Discharge status: Home / Self Care | Payer: MEDICAID

## 2024-06-02 DIAGNOSIS — K1379 Other lesions of oral mucosa: Secondary | ICD-10-CM | POA: Diagnosis not present

## 2024-06-02 DIAGNOSIS — B001 Herpesviral vesicular dermatitis: Secondary | ICD-10-CM

## 2024-06-02 DIAGNOSIS — L709 Acne, unspecified: Secondary | ICD-10-CM | POA: Diagnosis not present

## 2024-06-02 MED ORDER — CHLORHEXIDINE GLUCONATE 0.12 % MT SOLN: 15.0000 mL | Freq: Two times a day (BID) | OROMUCOSAL | 0 refills | Status: DC

## 2024-06-02 NOTE — ED Triage Notes (Signed)
 Pt reports he has lesions on his lips x 2 days

## 2024-06-02 NOTE — ED Provider Notes (Signed)
 [facility]    CSN: [id number] Arrival date & time: 06/02/24  1913      History   Chief Complaint No chief complaint on file.   HPI Ronald SELLMAN is a 23 y.o. male.   The history is provided by the patient.   Patient presents for complaints of pain to the lips and outbreak of lesions to his face.  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  States that he was wondering if there is any other medications that he could take.  He also complains of mouth pain.  Patient denies fever, chills, pain with swallowing, oozing, or drainage.    He also complains of increased lesions to his face.  He denies any exposures to new soaps, medications, lotions, foods, or detergents.  States that he has been cleaning his face with over-the-counter astringents. Past Medical History:  Diagnosis Date   Bipolar 1 disorder (HCC)    Substance abuse [facility])     Patient Active Problem List   Diagnosis Date Noted   Methamphetamine abuse (HCC) 02/02/2024   Substance abuse (HCC) 02/02/2024   Bipolar I disorder, most recent episode (or current) manic (HCC) 08/04/2023   Depression 08/01/2023   Bipolar affective disorder, depressed, severe (HCC) 07/31/2023   Methamphetamine dependence (HCC) 07/31/2023    History reviewed. No pertinent surgical history.     Home Medications    Prior to Admission medications   Medication Sig Start Date End Date Taking? Authorizing Provider  chlorhexidine  (PERIDEX ) 0.12 % solution Use as directed 15 mLs in the mouth or throat 2 (two) times daily for 14 days. Gargle 15mL of mouthwash for 30 seconds to 1 minute and spit twice daily. 06/02/24 06/16/24 Yes Leath-Warren, Etta PARAS, NP  valACYclovir (VALTREX) 500 MG tablet Take 500 mg by mouth 2 (two) times daily. 05/31/24  Yes [provider]  ARIPiprazole  (ABILIFY ) 10 MG tablet  Take 1 tablet (10 mg total) by mouth daily. 02/09/24   Izella Ismael NOVAK, MD  divalproex  (DEPAKOTE ) 500 MG DR tablet Take 1 tablet (500 mg total) by mouth every 12 (twelve) hours. 02/09/24   Izella Ismael NOVAK, MD  emtricitabine -tenofovir  AF (DESCOVY ) 200-25 MG tablet Take 1 tablet by mouth daily. 02/09/24   Izella Ismael NOVAK, MD  hydrOXYzine  (ATARAX ) 25 MG tablet Take 1 tablet (25 mg total) by mouth 3 (three) times daily. 02/09/24   Izella Ismael NOVAK, MD  nicotine  (NICODERM CQ  - DOSED IN MG/24 HOURS) 21 mg/24hr patch Place 1 patch (21 mg total) onto the skin daily. 02/09/24   Izella Ismael NOVAK, MD    Family History History reviewed. No pertinent family history.  Social History Social History   Tobacco Use   Smoking status: Never   Smokeless tobacco: Never  Substance Use Topics   Alcohol use: Not Currently   Drug use: Yes    Types: Methamphetamines    Comment: Pt smokes meth. Last smoked around 12pm today     Allergies   Patient has no known allergies.   Review of Systems Review of Systems Per HPI  Physical Exam Triage Vital Signs ED Triage Vitals  Encounter Vitals Group     BP 06/02/24 1923 125/81     Girls Systolic BP Percentile --      Girls Diastolic BP Percentile --  Boys Systolic BP Percentile --      Boys Diastolic BP Percentile --      Pulse Rate 06/02/24 1923 (!) 117     Resp 06/02/24 1923 18     Temp 06/02/24 1923 98.5 F (36.9 C)     Temp Source 06/02/24 1923 Oral     SpO2 06/02/24 1923 96 %     Weight --      Height --      Head Circumference --      Peak Flow --      Pain Score 06/02/24 1924 4     Pain Loc --      Pain Education --      Exclude from Growth Chart --    No data found.  Updated Vital Signs BP 125/81 (BP Location: Right Arm)   Pulse (!) 117   Temp 98.5 F (36.9 C) (Oral)   Resp 18   SpO2 96%   Visual Acuity Right Eye Distance:   Left Eye Distance:   Bilateral Distance:    Right Eye Near:   Left Eye Near:    Bilateral Near:      Physical Exam Vitals and nursing note reviewed.  Constitutional:      General: He is not in acute distress.    Appearance: Normal appearance.  HENT:     Head: Normocephalic.     Mouth/Throat:     Lips: Pink.     Mouth: Mucous membranes are dry. Oral lesions (Right left lower lip) present.     Pharynx: Oropharynx is clear. Uvula midline.  Eyes:     Extraocular Movements: Extraocular movements intact.     Pupils: Pupils are equal, round, and reactive to light.  Cardiovascular:     Rate and Rhythm: Normal rate and regular rhythm.     Pulses: Normal pulses.     Heart sounds: Normal heart sounds.  Pulmonary:     Effort: Pulmonary effort is normal.     Breath sounds: Normal breath sounds.  Musculoskeletal:     Cervical back: Normal range of motion.  Skin:    General: Skin is warm and dry.     Findings: Acne present.     Comments: Comedones noted to the bilateral cheeks.  Small papules noted throughout.  No nodules or cyst present.  Scarring is present.    Neurological:     General: No focal deficit present.     Mental Status: He is alert and oriented to person, place, and time.  Psychiatric:        Mood and Affect: Mood normal.        Behavior: Behavior normal.      UC Treatments / Results  Labs (all labs ordered are listed, but only abnormal results are displayed) Labs Reviewed - No data to display  EKG   Radiology No results found.  Procedures Procedures (including critical care time)  Medications Ordered in UC Medications - No data to display  Initial Impression / Assessment and Plan / UC Course  I have reviewed the triage vital signs and the nursing notes.  Pertinent labs & imaging results that were available during my care of the patient were reviewed by me and considered in my medical decision making (see chart for details).  Patient is currently taking Valtrex for herpes labialis.  Peridex  0.12% solution prescribed for patient to gargle and spit for  mild pain or discomfort.  With regard to his skin, recommend trying cleanser such as  Cetaphil.  Encouraged increasing his fluids, and eating a healthy diet.  Discussion with patient regarding follow-up.  Patient was in agreement with this plan of care and verbalizes understanding.  All questions were answered.  Patient stable for discharge. Final Clinical Impressions(s) / UC Diagnoses   Final diagnoses:  Herpes labialis  Mouth pain     Discharge Instructions      Take medication as prescribed. Continue the valacyclovir you are currently taking. You may take over-the-counter Tylenol  as needed for pain, fever, or general discomfort. Apply cool compresses to the affected areas to help with pain or swelling. Recommend gargling warm salt water to help with mild pain or discomfort. Apply petroleum jelly to the lips twice daily to help keep the lips moist and lubricated. Make sure you are drinking at least 8-10 8 ounce glasses of water daily to help keep the skin from drying out. If symptoms fail to improve with this treatment, recommend follow-up in this clinic or with your primary care physician for further evaluation. Follow-up as needed.   ED Prescriptions     Medication Sig Dispense Auth. Provider   chlorhexidine  (PERIDEX ) 0.12 % solution Use as directed 15 mLs in the mouth or throat 2 (two) times daily for 14 days. Gargle 15mL of mouthwash for 30 seconds to 1 minute and spit twice daily. 420 mL Leath-Warren, Etta PARAS, NP      PDMP not reviewed this encounter.   Gilmer Etta PARAS, NP 06/02/24 1958

## 2024-06-02 NOTE — Discharge Instructions (Addendum)
 Take medication as prescribed. Continue the valacyclovir you are currently taking. You may take over-the-counter Tylenol  as needed for pain, fever, or general discomfort. Apply cool compresses to the affected areas to help with pain or swelling. Recommend gargling warm salt water to help with mild pain or discomfort. Apply petroleum jelly to the lips twice daily to help keep the lips moist and lubricated. Make sure you are drinking at least 8-10 8 ounce glasses of water daily to help keep the skin from drying out. If symptoms fail to improve with this treatment, recommend follow-up in this clinic or with your primary care physician for further evaluation. Follow-up as needed.

## 2024-06-06 ENCOUNTER — Telehealth: Payer: Self-pay

## 2024-06-06 MED ORDER — CHLORHEXIDINE GLUCONATE 0.12 % MT SOLN: 15.0000 mL | Freq: Two times a day (BID) | OROMUCOSAL | 0 refills | Status: AC

## 2024-06-06 NOTE — Telephone Encounter (Signed)
 Pt called stating his medication from his visit here on 06/02/24 was not received at [facility] in [place]. Spoke with provider and re-sent in chlorhexidine  to [facility] in [place].
